# Patient Record
Sex: Female | Born: 1935
Health system: Southern US, Community
[De-identification: ages and names within clinical notes are randomized; demographics above are authoritative.]

## PROBLEM LIST (undated history)

## (undated) ENCOUNTER — Inpatient Hospital Stay: Payer: Medicare Other

## (undated) DIAGNOSIS — M1990 Unspecified osteoarthritis, unspecified site: Secondary | ICD-10-CM

## (undated) DIAGNOSIS — Z8619 Personal history of other infectious and parasitic diseases: Secondary | ICD-10-CM

## (undated) DIAGNOSIS — N390 Urinary tract infection, site not specified: Secondary | ICD-10-CM

## (undated) DIAGNOSIS — K219 Gastro-esophageal reflux disease without esophagitis: Secondary | ICD-10-CM

## (undated) DIAGNOSIS — J309 Allergic rhinitis, unspecified: Secondary | ICD-10-CM

## (undated) HISTORY — DX: Urinary tract infection, site not specified: N39.0

## (undated) HISTORY — DX: Personal history of other infectious and parasitic diseases: Z86.19

## (undated) HISTORY — DX: Unspecified osteoarthritis, unspecified site: M19.90

## (undated) HISTORY — PX: COLONOSCOPY: SHX174

## (undated) HISTORY — PX: TONSILLECTOMY: SUR1361

## (undated) HISTORY — DX: Allergic rhinitis, unspecified: J30.9

---

## 2014-04-20 DIAGNOSIS — B029 Zoster without complications: Secondary | ICD-10-CM | POA: Diagnosis not present

## 2014-04-25 DIAGNOSIS — J029 Acute pharyngitis, unspecified: Secondary | ICD-10-CM | POA: Diagnosis not present

## 2014-04-25 DIAGNOSIS — H6120 Impacted cerumen, unspecified ear: Secondary | ICD-10-CM | POA: Diagnosis not present

## 2014-04-28 DIAGNOSIS — J069 Acute upper respiratory infection, unspecified: Secondary | ICD-10-CM | POA: Diagnosis not present

## 2014-05-12 DIAGNOSIS — J00 Acute nasopharyngitis [common cold]: Secondary | ICD-10-CM | POA: Diagnosis not present

## 2014-05-12 DIAGNOSIS — B029 Zoster without complications: Secondary | ICD-10-CM | POA: Diagnosis not present

## 2014-05-12 DIAGNOSIS — J069 Acute upper respiratory infection, unspecified: Secondary | ICD-10-CM | POA: Diagnosis not present

## 2014-05-12 DIAGNOSIS — K219 Gastro-esophageal reflux disease without esophagitis: Secondary | ICD-10-CM | POA: Diagnosis not present

## 2014-07-27 DIAGNOSIS — Z0000 Encounter for general adult medical examination without abnormal findings: Secondary | ICD-10-CM | POA: Diagnosis not present

## 2014-07-27 DIAGNOSIS — M810 Age-related osteoporosis without current pathological fracture: Secondary | ICD-10-CM | POA: Diagnosis not present

## 2014-07-27 DIAGNOSIS — Z1211 Encounter for screening for malignant neoplasm of colon: Secondary | ICD-10-CM | POA: Diagnosis not present

## 2014-07-27 DIAGNOSIS — R4582 Worries: Secondary | ICD-10-CM | POA: Diagnosis not present

## 2014-07-27 DIAGNOSIS — Z1389 Encounter for screening for other disorder: Secondary | ICD-10-CM | POA: Diagnosis not present

## 2014-07-27 DIAGNOSIS — R634 Abnormal weight loss: Secondary | ICD-10-CM | POA: Diagnosis not present

## 2014-07-27 DIAGNOSIS — Z1231 Encounter for screening mammogram for malignant neoplasm of breast: Secondary | ICD-10-CM | POA: Diagnosis not present

## 2014-07-27 DIAGNOSIS — K5900 Constipation, unspecified: Secondary | ICD-10-CM | POA: Diagnosis not present

## 2014-08-05 DIAGNOSIS — R05 Cough: Secondary | ICD-10-CM | POA: Diagnosis not present

## 2014-08-05 DIAGNOSIS — K219 Gastro-esophageal reflux disease without esophagitis: Secondary | ICD-10-CM | POA: Diagnosis not present

## 2014-08-05 DIAGNOSIS — E780 Pure hypercholesterolemia: Secondary | ICD-10-CM | POA: Diagnosis not present

## 2014-08-05 DIAGNOSIS — J209 Acute bronchitis, unspecified: Secondary | ICD-10-CM | POA: Diagnosis not present

## 2014-08-14 DIAGNOSIS — B029 Zoster without complications: Secondary | ICD-10-CM | POA: Diagnosis not present

## 2014-08-19 DIAGNOSIS — R05 Cough: Secondary | ICD-10-CM | POA: Diagnosis not present

## 2014-08-25 DIAGNOSIS — K219 Gastro-esophageal reflux disease without esophagitis: Secondary | ICD-10-CM | POA: Diagnosis not present

## 2014-08-25 DIAGNOSIS — J209 Acute bronchitis, unspecified: Secondary | ICD-10-CM | POA: Diagnosis not present

## 2014-08-25 DIAGNOSIS — E780 Pure hypercholesterolemia: Secondary | ICD-10-CM | POA: Diagnosis not present

## 2014-08-25 DIAGNOSIS — R05 Cough: Secondary | ICD-10-CM | POA: Diagnosis not present

## 2014-09-03 DIAGNOSIS — K5900 Constipation, unspecified: Secondary | ICD-10-CM | POA: Diagnosis not present

## 2014-09-03 DIAGNOSIS — K30 Functional dyspepsia: Secondary | ICD-10-CM | POA: Diagnosis not present

## 2014-10-22 DIAGNOSIS — R300 Dysuria: Secondary | ICD-10-CM | POA: Diagnosis not present

## 2014-10-22 DIAGNOSIS — N760 Acute vaginitis: Secondary | ICD-10-CM | POA: Diagnosis not present

## 2014-11-09 DIAGNOSIS — R300 Dysuria: Secondary | ICD-10-CM | POA: Diagnosis not present

## 2014-11-09 DIAGNOSIS — N760 Acute vaginitis: Secondary | ICD-10-CM | POA: Diagnosis not present

## 2014-12-02 DIAGNOSIS — M5032 Other cervical disc degeneration, mid-cervical region: Secondary | ICD-10-CM | POA: Diagnosis not present

## 2014-12-03 DIAGNOSIS — H25013 Cortical age-related cataract, bilateral: Secondary | ICD-10-CM | POA: Diagnosis not present

## 2014-12-03 DIAGNOSIS — H2513 Age-related nuclear cataract, bilateral: Secondary | ICD-10-CM | POA: Diagnosis not present

## 2014-12-10 DIAGNOSIS — M542 Cervicalgia: Secondary | ICD-10-CM | POA: Diagnosis not present

## 2014-12-11 DIAGNOSIS — M542 Cervicalgia: Secondary | ICD-10-CM | POA: Diagnosis not present

## 2014-12-15 DIAGNOSIS — M542 Cervicalgia: Secondary | ICD-10-CM | POA: Diagnosis not present

## 2014-12-17 DIAGNOSIS — M542 Cervicalgia: Secondary | ICD-10-CM | POA: Diagnosis not present

## 2014-12-21 DIAGNOSIS — M542 Cervicalgia: Secondary | ICD-10-CM | POA: Diagnosis not present

## 2014-12-24 DIAGNOSIS — M542 Cervicalgia: Secondary | ICD-10-CM | POA: Diagnosis not present

## 2014-12-29 DIAGNOSIS — M542 Cervicalgia: Secondary | ICD-10-CM | POA: Diagnosis not present

## 2014-12-30 DIAGNOSIS — I10 Essential (primary) hypertension: Secondary | ICD-10-CM | POA: Diagnosis not present

## 2014-12-30 DIAGNOSIS — H6120 Impacted cerumen, unspecified ear: Secondary | ICD-10-CM | POA: Diagnosis not present

## 2015-01-21 DIAGNOSIS — Z23 Encounter for immunization: Secondary | ICD-10-CM | POA: Diagnosis not present

## 2015-03-24 DIAGNOSIS — I10 Essential (primary) hypertension: Secondary | ICD-10-CM | POA: Diagnosis not present

## 2015-03-24 DIAGNOSIS — R05 Cough: Secondary | ICD-10-CM | POA: Diagnosis not present

## 2015-03-24 DIAGNOSIS — K219 Gastro-esophageal reflux disease without esophagitis: Secondary | ICD-10-CM | POA: Diagnosis not present

## 2015-03-24 DIAGNOSIS — E7800 Pure hypercholesterolemia, unspecified: Secondary | ICD-10-CM | POA: Diagnosis not present

## 2015-03-24 DIAGNOSIS — E530 Riboflavin deficiency: Secondary | ICD-10-CM | POA: Diagnosis not present

## 2015-03-24 DIAGNOSIS — K5904 Chronic idiopathic constipation: Secondary | ICD-10-CM | POA: Diagnosis not present

## 2015-03-31 DIAGNOSIS — Z1211 Encounter for screening for malignant neoplasm of colon: Secondary | ICD-10-CM | POA: Diagnosis not present

## 2015-03-31 DIAGNOSIS — Z124 Encounter for screening for malignant neoplasm of cervix: Secondary | ICD-10-CM | POA: Diagnosis not present

## 2015-04-01 DIAGNOSIS — K5900 Constipation, unspecified: Secondary | ICD-10-CM | POA: Diagnosis not present

## 2015-04-01 DIAGNOSIS — K30 Functional dyspepsia: Secondary | ICD-10-CM | POA: Diagnosis not present

## 2015-06-10 DIAGNOSIS — H04123 Dry eye syndrome of bilateral lacrimal glands: Secondary | ICD-10-CM | POA: Diagnosis not present

## 2015-06-10 DIAGNOSIS — H25013 Cortical age-related cataract, bilateral: Secondary | ICD-10-CM | POA: Diagnosis not present

## 2015-06-10 DIAGNOSIS — H2513 Age-related nuclear cataract, bilateral: Secondary | ICD-10-CM | POA: Diagnosis not present

## 2015-07-02 DIAGNOSIS — J392 Other diseases of pharynx: Secondary | ICD-10-CM | POA: Diagnosis not present

## 2015-07-07 DIAGNOSIS — J301 Allergic rhinitis due to pollen: Secondary | ICD-10-CM | POA: Diagnosis not present

## 2015-08-04 DIAGNOSIS — D2271 Melanocytic nevi of right lower limb, including hip: Secondary | ICD-10-CM | POA: Diagnosis not present

## 2015-08-04 DIAGNOSIS — D224 Melanocytic nevi of scalp and neck: Secondary | ICD-10-CM | POA: Diagnosis not present

## 2015-08-04 DIAGNOSIS — Z85828 Personal history of other malignant neoplasm of skin: Secondary | ICD-10-CM | POA: Diagnosis not present

## 2015-08-04 DIAGNOSIS — D2239 Melanocytic nevi of other parts of face: Secondary | ICD-10-CM | POA: Diagnosis not present

## 2015-08-04 DIAGNOSIS — D485 Neoplasm of uncertain behavior of skin: Secondary | ICD-10-CM | POA: Diagnosis not present

## 2015-08-04 DIAGNOSIS — D225 Melanocytic nevi of trunk: Secondary | ICD-10-CM | POA: Diagnosis not present

## 2015-08-04 DIAGNOSIS — C44301 Unspecified malignant neoplasm of skin of nose: Secondary | ICD-10-CM | POA: Diagnosis not present

## 2015-08-04 DIAGNOSIS — D2272 Melanocytic nevi of left lower limb, including hip: Secondary | ICD-10-CM | POA: Diagnosis not present

## 2015-08-12 DIAGNOSIS — R300 Dysuria: Secondary | ICD-10-CM | POA: Diagnosis not present

## 2015-08-12 DIAGNOSIS — N952 Postmenopausal atrophic vaginitis: Secondary | ICD-10-CM | POA: Diagnosis not present

## 2015-08-22 DIAGNOSIS — B029 Zoster without complications: Secondary | ICD-10-CM | POA: Diagnosis not present

## 2015-10-04 DIAGNOSIS — Z85828 Personal history of other malignant neoplasm of skin: Secondary | ICD-10-CM | POA: Diagnosis not present

## 2015-10-04 DIAGNOSIS — C44311 Basal cell carcinoma of skin of nose: Secondary | ICD-10-CM | POA: Diagnosis not present

## 2015-10-06 ENCOUNTER — Ambulatory Visit: Payer: PRIVATE HEALTH INSURANCE | Admitting: Family Medicine

## 2015-10-22 ENCOUNTER — Telehealth: Payer: Self-pay | Admitting: Family Medicine

## 2015-10-22 ENCOUNTER — Encounter: Payer: Self-pay | Admitting: Family Medicine

## 2015-10-22 ENCOUNTER — Ambulatory Visit (INDEPENDENT_AMBULATORY_CARE_PROVIDER_SITE_OTHER): Payer: Medicare Other | Admitting: Family Medicine

## 2015-10-22 VITALS — BP 100/58 | HR 93 | Temp 98.4°F | Ht 62.0 in | Wt 117.5 lb

## 2015-10-22 DIAGNOSIS — R634 Abnormal weight loss: Secondary | ICD-10-CM

## 2015-10-22 DIAGNOSIS — C4491 Basal cell carcinoma of skin, unspecified: Secondary | ICD-10-CM

## 2015-10-22 DIAGNOSIS — K5900 Constipation, unspecified: Secondary | ICD-10-CM | POA: Diagnosis not present

## 2015-10-22 LAB — CBC WITH DIFFERENTIAL/PLATELET
BASOS ABS: 0 {cells}/uL (ref 0–200)
Basophils Relative: 0 %
EOS ABS: 84 {cells}/uL (ref 15–500)
EOS PCT: 1 %
HCT: 43.1 % (ref 35.0–45.0)
HEMOGLOBIN: 14.4 g/dL (ref 11.7–15.5)
Lymphocytes Relative: 26 %
Lymphs Abs: 2184 cells/uL (ref 850–3900)
MCH: 29.8 pg (ref 27.0–33.0)
MCHC: 33.4 g/dL (ref 32.0–36.0)
MCV: 89 fL (ref 80.0–100.0)
MONOS PCT: 9 %
MPV: 10.5 fL (ref 7.5–12.5)
Monocytes Absolute: 756 cells/uL (ref 200–950)
NEUTROS ABS: 5376 {cells}/uL (ref 1500–7800)
NEUTROS PCT: 64 %
PLATELETS: 236 10*3/uL (ref 140–400)
RBC: 4.84 MIL/uL (ref 3.80–5.10)
RDW: 13.5 % (ref 11.0–15.0)
WBC: 8.4 10*3/uL (ref 3.8–10.8)

## 2015-10-22 LAB — SEDIMENTATION RATE: Sed Rate: 4 mm/hr (ref 0–30)

## 2015-10-22 LAB — TSH: TSH: 1.48 mIU/L

## 2015-10-22 NOTE — Patient Instructions (Signed)
Nice to see you. We are going to obtain some lab work to evaluate her weight loss. We will request her records from her prior physicians in your dermatologist in [place]. Please continue your stool softener MiraLAX for your constipation. If you develop abdominal pain, fevers, blood in your stool, or any new or changing symptoms please seek medical attention.

## 2015-10-22 NOTE — Progress Notes (Signed)
Patient ID: Carol Stark, female   DOB: 22-Oct-1931, 82 y.o.   MRN: [id number]  Tommi Rumps, MD Phone: [phone]  Carol Stark is a 82 y.o. female who presents today for new patient visit.  Unintentional weight loss: Patient notes over the last several months she has lost about 20 pounds. Has been gradual. Was around 140. Has had a lot of stress related to moving and a lot of hassle related to the person who was managing her money previously. She notes a: Guarded about a year ago that was negative. Mammogram was about a year ago that was negative. No recent Pap smears. No significant night sweats. No fevers. No cough. She has been exposed to TB in the past and never diagnosed with TB. She underwent treatment for 6 months for this exposure. She does have a history of skin cancer and had a recent Mohs surgery on her nose. She is unsure what kind of cancer was.  She also notes some constipation. She is currently on a stool softener and MiraLAX. This is helped significant. Bowel movements daily. No abdominal pain. No blood in her stool. Notes she's not quite back to normal.   Active Ambulatory Problems    Diagnosis Date Noted  . Unintentional weight loss 10/22/2015  . Constipation 10/22/2015   Resolved Ambulatory Problems    Diagnosis Date Noted  . No Resolved Ambulatory Problems   Past Medical History  Diagnosis Date  . Arthritis   . History of chicken pox   . Allergic rhinitis   . UTI (lower urinary tract infection)     Family History  Problem Relation Age of Onset  . Arthritis Mother   . Heart disease Father     Social History   Social History  . Marital Status: Single    Spouse Name: N/A  . Number of Children: N/A  . Years of Education: N/A   Occupational History  . Not on file.   Social History Main Topics  . Smoking status: Never Smoker   . Smokeless tobacco: Not on file  . Alcohol Use: 0.0 oz/week    0 Standard drinks or equivalent per week  .  Drug Use: No  . Sexual Activity: Not on file   Other Topics Concern  . Not on file   Social History Narrative  . No narrative on file    ROS  General: Positive or unexplained weight loss,negative for  fever Skin: Negative for new or changing mole, sore that won't heal HEENT: Negative for trouble hearing, trouble seeing, ringing in ears, mouth sores, hoarseness, change in voice, dysphagia. CV:  Negative for chest pain, dyspnea, edema, palpitations Resp: Negative for cough, dyspnea, hemoptysis GI:  positive for constipation, Negative for nausea, vomiting, diarrhea, abdominal pain, melena, hematochezia. GU: Negative for dysuria, incontinence, urinary hesitance, hematuria, vaginal or penile discharge, polyuria, sexual difficulty, lumps in testicle or breasts MSK: Negative for muscle cramps or aches,  positive joint pain or swelling Neuro: Negative for headaches, weakness, numbness, dizziness, passing out/fainting Psych:  positive for stress, Negative for depression, anxiety, memory problems  Objective  Physical Exam Filed Vitals:   10/22/15 1454  BP: 100/58  Pulse: 93  Temp: 98.4 F (36.9 C)    BP Readings from Last 3 Encounters:  10/22/15 100/58   Wt Readings from Last 3 Encounters:  10/22/15 117 lb 8 oz (53.298 kg)    Physical Exam  Constitutional: She is well-developed, well-nourished, and in no distress.  HENT:  Head: Normocephalic and atraumatic.  Right Ear: External ear normal.  Left Ear: External ear normal.  Mouth/Throat: Oropharynx is clear and moist. No oropharyngeal exudate.  Right upper outer area of the iris with brown spot noted that the patient states has been there her whole life  Eyes: Conjunctivae are normal. Pupils are equal, round, and reactive to light.  Neck: Neck supple.  No supraclavicular lymphadenopathy  Cardiovascular: Normal rate, regular rhythm and normal heart sounds.   Pulmonary/Chest: Effort normal and breath sounds normal.  Abdominal:  Soft. Bowel sounds are normal. She exhibits no distension. There is no tenderness. There is no rebound and no guarding.  Musculoskeletal: She exhibits no edema.  Lymphadenopathy:    She has no cervical adenopathy.  Neurological: She is alert. Gait normal.  Skin: Skin is warm and dry.  Psychiatric: Mood and affect normal.     Assessment/Plan:   Unintentional weight loss Patient reports about a 20 pound weight loss over the last several months. This is unintentional. Notes a significant amount of stress recently and she attributes it to this. Of note she has recently had a skin cancer removed from her nose has also been exposed to TB in the past though notes no symptoms related to the TV. Reports she had a negative chest x-ray. Overall benign exam. We will check some basic lab work as outlined below. We'll request records for cancer screening in the past. We will request her dermatology records as well. Depending on lab work and results from records will determine further workup at that time. She will continue to monitor her weight.  Constipation Improving with MiraLAX and stool softener. Benign exam. She'll continue this. Continue monitor.    Orders Placed This Encounter  Procedures  . C-reactive protein  . Sed Rate (ESR)  . Comp Met (CMET)  . CBC w/Diff  . TSH    Tommi Rumps, MD [place]

## 2015-10-22 NOTE — Progress Notes (Signed)
Pre visit review using our clinic review tool, if applicable. No additional management support is needed unless otherwise documented below in the visit note. 

## 2015-10-22 NOTE — Assessment & Plan Note (Addendum)
Improving with MiraLAX and stool softener. Benign exam. She'll continue this. Continue monitor.

## 2015-10-22 NOTE — Telephone Encounter (Signed)
The patient called to inform the physician that a basil cell mole was removed by [place] phone # [phone].

## 2015-10-22 NOTE — Assessment & Plan Note (Addendum)
Patient reports about a 20 pound weight loss over the last several months. This is unintentional. Notes a significant amount of stress recently and she attributes it to this. Of note she has recently had a skin cancer removed from her nose has also been exposed to TB in the past though notes no symptoms related to the TV. Reports she had a negative chest x-ray. Overall benign exam. We will check some basic lab work as outlined below. We'll request records for cancer screening in the past. We will request her dermatology records as well. Depending on lab work and results from records will determine further workup at that time. She will continue to monitor her weight.

## 2015-10-23 LAB — COMPREHENSIVE METABOLIC PANEL
ALBUMIN: 4.2 g/dL (ref 3.6–5.1)
ALK PHOS: 61 U/L (ref 33–130)
ALT: 12 U/L (ref 6–29)
AST: 15 U/L (ref 10–35)
BILIRUBIN TOTAL: 0.3 mg/dL (ref 0.2–1.2)
BUN: 17 mg/dL (ref 7–25)
CO2: 24 mmol/L (ref 20–31)
Calcium: 9.2 mg/dL (ref 8.6–10.4)
Chloride: 102 mmol/L (ref 98–110)
Creat: 0.77 mg/dL (ref 0.60–0.88)
GLUCOSE: 91 mg/dL (ref 65–99)
POTASSIUM: 4.1 mmol/L (ref 3.5–5.3)
Sodium: 140 mmol/L (ref 135–146)
TOTAL PROTEIN: 7 g/dL (ref 6.1–8.1)

## 2015-10-23 LAB — C-REACTIVE PROTEIN: CRP: <0.5 mg/dL (ref <0.60)

## 2015-10-25 DIAGNOSIS — C4491 Basal cell carcinoma of skin, unspecified: Secondary | ICD-10-CM | POA: Insufficient documentation

## 2015-10-25 NOTE — Telephone Encounter (Signed)
FYI

## 2015-10-25 NOTE — Telephone Encounter (Signed)
Noted. Please request records from Dr. Sarajane Jews.

## 2015-11-08 ENCOUNTER — Ambulatory Visit (INDEPENDENT_AMBULATORY_CARE_PROVIDER_SITE_OTHER): Payer: Medicare Other | Admitting: Family Medicine

## 2015-11-08 ENCOUNTER — Encounter (INDEPENDENT_AMBULATORY_CARE_PROVIDER_SITE_OTHER): Payer: Self-pay

## 2015-11-08 DIAGNOSIS — R634 Abnormal weight loss: Secondary | ICD-10-CM

## 2015-11-08 DIAGNOSIS — K219 Gastro-esophageal reflux disease without esophagitis: Secondary | ICD-10-CM | POA: Diagnosis not present

## 2015-11-08 NOTE — Progress Notes (Signed)
Pre visit review using our clinic review tool, if applicable. No additional management support is needed unless otherwise documented below in the visit note. 

## 2015-11-08 NOTE — Assessment & Plan Note (Signed)
Well-controlled with PPI. Continue to monitor.

## 2015-11-08 NOTE — Assessment & Plan Note (Signed)
Patient doing well overall. Prior workup unremarkable. Weight is actually up now. I believe this is likely related to stress. We will have her continue to monitor. We will have her return in 4-6 weeks for a weight check. If loses weight would consider further workup.

## 2015-11-08 NOTE — Progress Notes (Signed)
  Tommi Rumps, MD Phone: [phone]  Carol Stark is a 82 y.o. female who presents today for follow-up.  Patient presents for a weight check today. Had lost about 20 pounds in several months while in the process of moving. Notes she had been significantly stressed with this. She is much less stressed now and is eating more. Has gained several pounds. No depression or anxiety. No night sweats. Prior lab work was unremarkable. Skin cancer removed from her nose was a basal cell carcinoma. Does have rare reflux and rare nausea. Is taking esomeprazole and raising the head of the bed for her reflux. No blood in her stool. No abdominal pain.   ROS see history of present illness  Objective  Physical Exam Vitals:   11/08/15 1550  BP: 106/70  Pulse: 96  Temp: 98.2 F (36.8 C)    BP Readings from Last 3 Encounters:  11/08/15 106/70  10/22/15 (!) 100/58   Wt Readings from Last 3 Encounters:  11/08/15 119 lb 9.6 oz (54.3 kg)  10/22/15 117 lb 8 oz (53.3 kg)    Physical Exam  Constitutional: No distress.  HENT:  Head: Normocephalic and atraumatic.  Cardiovascular: Normal rate, regular rhythm and normal heart sounds.   Pulmonary/Chest: Effort normal and breath sounds normal.  Abdominal: Soft. Bowel sounds are normal. She exhibits no distension. There is no tenderness. There is no rebound and no guarding.  Neurological: She is alert. Gait normal.  Skin: Skin is warm and dry. She is not diaphoretic.  Psychiatric: Mood and affect normal.     Assessment/Plan: Please see individual problem list.  Unintentional weight loss Patient doing well overall. Prior workup unremarkable. Weight is actually up now. I believe this is likely related to stress. We will have her continue to monitor. We will have her return in 4-6 weeks for a weight check. If loses weight would consider further workup.  GERD (gastroesophageal reflux disease) Well-controlled with PPI. Continue to  monitor.   Tommi Rumps, MD [place]

## 2015-11-08 NOTE — Telephone Encounter (Signed)
Do not have signed medical release form

## 2015-11-08 NOTE — Patient Instructions (Signed)
Nice to see you. Your weight has moved in the right direction. Your weight loss is likely related to stress. Please continue to work on diet and managing her stress. Please continue your reflux medication. I would like for you to come back in 4-6 weeks for a nurse visit for a weight check and then 3-4 months with me.

## 2015-11-15 DIAGNOSIS — H6123 Impacted cerumen, bilateral: Secondary | ICD-10-CM | POA: Diagnosis not present

## 2015-12-15 ENCOUNTER — Ambulatory Visit (INDEPENDENT_AMBULATORY_CARE_PROVIDER_SITE_OTHER): Payer: Medicare Other | Admitting: Family Medicine

## 2015-12-15 ENCOUNTER — Encounter (INDEPENDENT_AMBULATORY_CARE_PROVIDER_SITE_OTHER): Payer: Self-pay

## 2015-12-15 ENCOUNTER — Encounter: Payer: Self-pay | Admitting: Family Medicine

## 2015-12-15 DIAGNOSIS — K5900 Constipation, unspecified: Secondary | ICD-10-CM | POA: Diagnosis not present

## 2015-12-15 DIAGNOSIS — K219 Gastro-esophageal reflux disease without esophagitis: Secondary | ICD-10-CM

## 2015-12-15 MED ORDER — RANITIDINE HCL 150 MG PO TABS: 150.0000 mg | ORAL_TABLET | Freq: Two times a day (BID) | ORAL | 1 refills | Status: DC

## 2015-12-15 MED ORDER — POLYETHYLENE GLYCOL 3350 17 GM/SCOOP PO POWD: 17.0000 g | Freq: Two times a day (BID) | ORAL | 1 refills | Status: DC

## 2015-12-15 MED ORDER — ESOMEPRAZOLE MAGNESIUM 40 MG PO CPDR: 40.0000 mg | DELAYED_RELEASE_CAPSULE | Freq: Every day | ORAL | 3 refills | Status: DC

## 2015-12-15 NOTE — Progress Notes (Signed)
Pre visit review using our clinic review tool, if applicable. No additional management support is needed unless otherwise documented below in the visit note. 

## 2015-12-15 NOTE — Assessment & Plan Note (Signed)
Some sour taste today. No other reflux symptoms. We'll have her continue her Nexium. If the Nexium is not beneficial she will start on Zantac in addition to her Nexium to see if this helps with the reflux. If this is not beneficial she will let us know.

## 2015-12-15 NOTE — Patient Instructions (Signed)
Nice to see you. We're going to have you increase your dose of MiraLAX to twice daily. We will refill your Nexium for your reflux. You should add Zantac if the Nexium is not beneficial. If you develop worsening stomach pain, blood in your stool, inability to pass gas, vomiting, or any new or changing symptoms please seek medical attention.

## 2015-12-15 NOTE — Progress Notes (Signed)
  Tommi Rumps, MD Phone: [phone]  Carol Stark is a 82 y.o. female who presents today for same day visit.  Patient reports constipation over the last week. Initially started MiraLAX and Dulcolax. Had extreme cramps with the Dulcolax. Then was given an enema 2 by the nurse at her facility. Notes bowel movements have been small balls of stool though had improved bowel movements after the enemas. Continuing to have small balls of stool and having bowel movements. Last bowel movement was yesterday. Taking in some food. Drinking prune juice. She is passing gas. No vomiting. No abdominal pain. Does note a bad taste in her mouth and describes it as sour just this morning. No reflux burning sensation. Takes Nexium at bedtime. No blood in her stool.  PMH: Reports a history of constipation in the past that was previously responsive to fiber supplementation   ROS see history of present illness  Objective  Physical Exam Vitals:   12/15/15 1302  BP: 136/68  Pulse: 92  Temp: 98.2 F (36.8 C)    BP Readings from Last 3 Encounters:  12/15/15 136/68  11/08/15 106/70  10/22/15 (!) 100/58   Wt Readings from Last 3 Encounters:  12/15/15 118 lb 9.6 oz (53.8 kg)  11/08/15 119 lb 9.6 oz (54.3 kg)  10/22/15 117 lb 8 oz (53.3 kg)    Physical Exam  Constitutional: No distress.  Cardiovascular: Normal rate, regular rhythm and normal heart sounds.   Pulmonary/Chest: Effort normal and breath sounds normal.  Abdominal: Soft. Bowel sounds are normal. She exhibits no distension. There is tenderness (minimal soreness throughout her mid abdomen). There is no rebound and no guarding.  Neurological: She is alert. Gait normal.  Skin: Skin is warm and dry. She is not diaphoretic.     Assessment/Plan: Please see individual problem list.  Constipation This issue has worsened recently. Mild soreness on abdominal exam. Discussed obtaining lab work to rule out other causes though she declined.  She could potentially still be constipated versus having cleaned herself completely out with the 2 enemas. We will have her increase her MiraLAX to twice daily. If not improving with MiraLAX would have her add Colace. If this is not beneficial could consider lactulose. She's given return precautions.  GERD (gastroesophageal reflux disease) Some sour taste today. No other reflux symptoms. We'll have her continue her Nexium. If the Nexium is not beneficial she will start on Zantac in addition to her Nexium to see if this helps with the reflux. If this is not beneficial she will let us know.   No orders of the defined types were placed in this encounter.   Meds ordered this encounter  Medications  . esomeprazole (NEXIUM) 40 MG capsule    Sig: Take 1 capsule (40 mg total) by mouth daily.    Dispense:  90 capsule    Refill:  3  . polyethylene glycol powder (GLYCOLAX/MIRALAX) powder    Sig: Take 17 g by mouth 2 (two) times daily.    Dispense:  3350 g    Refill:  1  . ranitidine (ZANTAC) 150 MG tablet    Sig: Take 1 tablet (150 mg total) by mouth 2 (two) times daily.    Dispense:  60 tablet    Refill:  [place], MD [place]

## 2015-12-15 NOTE — Assessment & Plan Note (Signed)
This issue has worsened recently. Mild soreness on abdominal exam. Discussed obtaining lab work to rule out other causes though she declined. She could potentially still be constipated versus having cleaned herself completely out with the 2 enemas. We will have her increase her MiraLAX to twice daily. If not improving with MiraLAX would have her add Colace. If this is not beneficial could consider lactulose. She's given return precautions.

## 2015-12-16 ENCOUNTER — Telehealth: Payer: Self-pay | Admitting: Family Medicine

## 2015-12-16 NOTE — Telephone Encounter (Signed)
Patient is still having the same [place] as yesterday during visit, no changes. please advise.

## 2015-12-16 NOTE — Telephone Encounter (Signed)
Patient can give the MiraLAX twice daily several days to work or I can send in lactulose for her to take. See what she would like to do. Thanks.

## 2015-12-16 NOTE — Telephone Encounter (Signed)
Patient was informed and she stated she will stay on miralax till next week and if not improvement she will call for something stronger.

## 2015-12-16 NOTE — Telephone Encounter (Signed)
Pt is not feeling any better. She was seen yesterday by Dr. Caryl Bis. Please advise pt. [phone].

## 2015-12-17 NOTE — Telephone Encounter (Signed)
Noted  

## 2015-12-21 DIAGNOSIS — R110 Nausea: Secondary | ICD-10-CM | POA: Diagnosis not present

## 2015-12-24 ENCOUNTER — Telehealth: Payer: Self-pay | Admitting: Family Medicine

## 2015-12-24 DIAGNOSIS — R110 Nausea: Secondary | ICD-10-CM | POA: Diagnosis not present

## 2015-12-24 DIAGNOSIS — K219 Gastro-esophageal reflux disease without esophagitis: Secondary | ICD-10-CM

## 2015-12-24 NOTE — Telephone Encounter (Signed)
Pt states that her stomach is really upset. States that she is trying Kaopectate now and is requesting a referral to [facility]. I gave her number to [facility] gastro to see if they can go ahead and schedule her. Can you please enter referral in case they require a one?

## 2015-12-24 NOTE — Telephone Encounter (Signed)
Referral has been placed. If her symptoms do not improve I would suggest reevaluation.

## 2015-12-27 ENCOUNTER — Ambulatory Visit (INDEPENDENT_AMBULATORY_CARE_PROVIDER_SITE_OTHER): Payer: Medicare Other | Admitting: Family Medicine

## 2015-12-27 ENCOUNTER — Encounter: Payer: Self-pay | Admitting: Family Medicine

## 2015-12-27 VITALS — BP 120/70 | HR 91 | Temp 98.6°F | Wt 114.0 lb

## 2015-12-27 DIAGNOSIS — R197 Diarrhea, unspecified: Secondary | ICD-10-CM | POA: Diagnosis not present

## 2015-12-27 DIAGNOSIS — R195 Other fecal abnormalities: Secondary | ICD-10-CM | POA: Insufficient documentation

## 2015-12-27 MED ORDER — ONDANSETRON HCL 4 MG PO TABS: 4.0000 mg | ORAL_TABLET | Freq: Three times a day (TID) | ORAL | 0 refills | Status: DC | PRN

## 2015-12-27 NOTE — Progress Notes (Signed)
  Tommi Rumps, MD Phone: [phone]  Carol Stark is a 82 y.o. female who presents today for follow-up.  Patient was seen several weeks ago for constipation. Notes more recently she has had looser stools than usual. More frequent stools as well. They're not liquidy. She's also had some nausea. Decreased appetite. No vomiting or blood in her stools. No fevers. Feels bloated and gassy. She is passing gas and having bowel movements daily. Has been taking Pepto-Bismol and Imodium for this. Drink plenty of fluids to not eating very much. No abdominal pain with this.  ROS see history of present illness  Objective  Physical Exam Vitals:   12/27/15 1551  BP: 120/70  Pulse: 91  Temp: 98.6 F (37 C)    BP Readings from Last 3 Encounters:  12/27/15 120/70  12/15/15 136/68  11/08/15 106/70   Wt Readings from Last 3 Encounters:  12/27/15 114 lb (51.7 kg)  12/15/15 118 lb 9.6 oz (53.8 kg)  11/08/15 119 lb 9.6 oz (54.3 kg)    Physical Exam  Constitutional: No distress.  Cardiovascular: Normal rate, regular rhythm and normal heart sounds.   Pulmonary/Chest: Effort normal and breath sounds normal.  Abdominal: Soft. Bowel sounds are normal. She exhibits no distension. There is no tenderness. There is no rebound and no guarding.  Neurological: She is alert. Gait normal.  Skin: Skin is warm and dry. She is not diaphoretic.     Assessment/Plan: Please see individual problem list.  Loose stools Patient recently treated for constipation though more recently having loose stools. Notes some nausea and bloating with this as well. Benign abdominal exam. Concerning thing is that she's not eating very much with this and has had 4 pounds of weight loss since I last saw her. She has already been referred to GI for further evaluation of this issue. We will check a CMP and CBC today. She will stop the Imodium. Zofran for nausea. I encouraged her to keep up her liquid intake. Return  precautions in AVS.   Orders Placed This Encounter  Procedures  . Comp Met (CMET)  . CBC    Meds ordered this encounter  Medications  . ondansetron (ZOFRAN) 4 MG tablet    Sig: Take 1 tablet (4 mg total) by mouth every 8 (eight) hours as needed for nausea or vomiting.    Dispense:  20 tablet    Refill:  0    Tommi Rumps, MD [place]

## 2015-12-27 NOTE — Patient Instructions (Signed)
Nice to see you. We are going to get some lab work to evaluate for further cause. You can take the Zofran for nausea. If you develop abdominal pain, blood in your stool, fevers, or any new or changing symptoms please seek medical attention.

## 2015-12-27 NOTE — Assessment & Plan Note (Signed)
Patient recently treated for constipation though more recently having loose stools. Notes some nausea and bloating with this as well. Benign abdominal exam. Concerning thing is that she's not eating very much with this and has had 4 pounds of weight loss since I last saw her. She has already been referred to GI for further evaluation of this issue. We will check a CMP and CBC today. She will stop the Imodium. Zofran for nausea. I encouraged her to keep up her liquid intake. Return precautions in AVS.

## 2015-12-28 LAB — COMPREHENSIVE METABOLIC PANEL
ALT: 10 U/L (ref 0–35)
AST: 13 U/L (ref 0–37)
Albumin: 4.2 g/dL (ref 3.5–5.2)
Alkaline Phosphatase: 65 U/L (ref 39–117)
BUN: 17 mg/dL (ref 6–23)
CALCIUM: 9.5 mg/dL (ref 8.4–10.5)
CHLORIDE: 104 meq/L (ref 96–112)
CO2: 30 meq/L (ref 19–32)
Creatinine, Ser: 0.73 mg/dL (ref 0.40–1.20)
GFR: 81.16 mL/min (ref >60.00)
Glucose, Bld: 97 mg/dL (ref 70–99)
POTASSIUM: 4 meq/L (ref 3.5–5.1)
Sodium: 139 mEq/L (ref 135–145)
Total Bilirubin: 0.4 mg/dL (ref 0.2–1.2)
Total Protein: 7.3 g/dL (ref 6.0–8.3)

## 2015-12-28 LAB — CBC
HEMATOCRIT: 42.5 % (ref 36.0–46.0)
Hemoglobin: 14.4 g/dL (ref 12.0–15.0)
MCHC: 33.8 g/dL (ref 30.0–36.0)
MCV: 88.9 fl (ref 78.0–100.0)
PLATELETS: 263 10*3/uL (ref 150.0–400.0)
RBC: 4.78 Mil/uL (ref 3.87–5.11)
RDW: 13 % (ref 11.5–15.5)
WBC: 9.1 10*3/uL (ref 4.0–10.5)

## 2015-12-29 ENCOUNTER — Telehealth: Payer: Self-pay | Admitting: *Deleted

## 2015-12-29 NOTE — Telephone Encounter (Signed)
Informed patient that results have not been interpreted by Dr. Caryl Bis yet. Patient stated you can leave meassage at eityher [phone] and [phone]

## 2015-12-29 NOTE — Telephone Encounter (Signed)
Patient requested lab results  Pt contact [phone]

## 2015-12-31 ENCOUNTER — Ambulatory Visit
Admission: RE | Admit: 2015-12-31 | Discharge: 2015-12-31 | Disposition: A | Discharge status: Home / Self Care | Payer: Medicare Other | Source: Ambulatory Visit | Attending: Family Medicine | Admitting: Family Medicine

## 2015-12-31 ENCOUNTER — Ambulatory Visit (INDEPENDENT_AMBULATORY_CARE_PROVIDER_SITE_OTHER): Payer: Medicare Other | Admitting: Family Medicine

## 2015-12-31 VITALS — BP 120/74 | HR 94 | Temp 98.3°F | Wt 116.0 lb

## 2015-12-31 DIAGNOSIS — K5900 Constipation, unspecified: Secondary | ICD-10-CM | POA: Insufficient documentation

## 2015-12-31 NOTE — Progress Notes (Signed)
Pre visit review using our clinic review tool, if applicable. No additional management support is needed unless otherwise documented below in the visit note. 

## 2015-12-31 NOTE — Assessment & Plan Note (Signed)
Patient with intermittent constipation and loose stools. Having more normal bowel movements currently. Complained of darker stools and FOBT was negative. Given persistent issues with this we will obtain a KUB. She will take Zofran for nausea. She'll keep her appointment with GI.

## 2015-12-31 NOTE — Progress Notes (Signed)
  Tommi Rumps, MD Phone: [phone]  Carol Stark is a 82 y.o. female who presents today for follow-up.  Patient notes she continues to not feel well. Just has no appetite. Feels very gassy. No abdominal pain. No blood in her stool. Does note darker and possibly black stools today. Has not taken any medicines over the last several days. Did take Pepto-Bismol last weekend. No diarrhea. No fevers. Has had 2 bowel movements in the last day. Overall just doesn't feel well. No fevers. Has an appointment with GI in October.  ROS see history of present illness  Objective  Physical Exam Vitals:   12/31/15 1052  BP: 120/74  Pulse: 94  Temp: 98.3 F (36.8 C)    BP Readings from Last 3 Encounters:  12/31/15 120/74  12/27/15 120/70  12/15/15 136/68   Wt Readings from Last 3 Encounters:  12/31/15 116 lb (52.6 kg)  12/27/15 114 lb (51.7 kg)  12/15/15 118 lb 9.6 oz (53.8 kg)    Physical Exam  Constitutional: No distress.  Cardiovascular: Normal rate, regular rhythm and normal heart sounds.   Pulmonary/Chest: Effort normal and breath sounds normal.  Abdominal: Soft. Bowel sounds are normal. She exhibits no distension. There is no tenderness. There is no rebound and no guarding.  Genitourinary: Rectal exam shows guaiac negative stool.  Genitourinary Comments: No impacted stool in the rectal vault, soft stool noted  Neurological: She is alert. Gait normal.  Skin: Skin is warm and dry. She is not diaphoretic.     Assessment/Plan: Please see individual problem list.  Constipation Patient with intermittent constipation and loose stools. Having more normal bowel movements currently. Complained of darker stools and FOBT was negative. Given persistent issues with this we will obtain a KUB. She will take Zofran for nausea. She'll keep her appointment with GI.   Orders Placed This Encounter  Procedures  . DG Abd 2 Views    Standing Status:   Future    Standing Expiration Date:    03/01/2017    Order Specific Question:   Reason for Exam (SYMPTOM  OR DIAGNOSIS REQUIRED)    Answer:   nausea, constipation, abdominal soreness for several weeks    Order Specific Question:   Preferred imaging location?    Answer:   [facility]    Tommi Rumps, MD [place]

## 2015-12-31 NOTE — Patient Instructions (Signed)
Nice to see you. Please keep your appointment with GI. Please try to eat small bland meals to see if that'll be beneficial. If you develop fevers, blood in her stool, abdominal pain, or any new or changing symptoms please seek medical attention.

## 2016-01-05 DIAGNOSIS — R110 Nausea: Secondary | ICD-10-CM | POA: Diagnosis not present

## 2016-01-10 ENCOUNTER — Telehealth: Payer: Self-pay | Admitting: Family Medicine

## 2016-01-10 NOTE — Telephone Encounter (Signed)
Pt called wanting her lab work to be sent to the doctor in [place] Dr Landry Mellow. Fax to [phone]. Thank you!  Please call pt if it does not go through. Thank you!

## 2016-01-10 NOTE — Telephone Encounter (Signed)
Faxed labs to patients old physician.

## 2016-01-13 ENCOUNTER — Other Ambulatory Visit: Payer: Self-pay | Admitting: Gastroenterology

## 2016-01-13 DIAGNOSIS — R1310 Dysphagia, unspecified: Secondary | ICD-10-CM

## 2016-01-13 DIAGNOSIS — R198 Other specified symptoms and signs involving the digestive system and abdomen: Secondary | ICD-10-CM | POA: Diagnosis not present

## 2016-01-13 DIAGNOSIS — K2100 Gastro-esophageal reflux disease with esophagitis, without bleeding: Secondary | ICD-10-CM

## 2016-01-13 DIAGNOSIS — K219 Gastro-esophageal reflux disease without esophagitis: Secondary | ICD-10-CM | POA: Diagnosis not present

## 2016-01-13 DIAGNOSIS — Z1211 Encounter for screening for malignant neoplasm of colon: Secondary | ICD-10-CM | POA: Diagnosis not present

## 2016-01-17 ENCOUNTER — Ambulatory Visit
Admission: RE | Admit: 2016-01-17 | Discharge: 2016-01-17 | Disposition: A | Discharge status: Home / Self Care | Payer: Medicare Other | Source: Ambulatory Visit | Attending: Gastroenterology | Admitting: Gastroenterology

## 2016-01-17 DIAGNOSIS — K2100 Gastro-esophageal reflux disease with esophagitis, without bleeding: Secondary | ICD-10-CM

## 2016-01-17 DIAGNOSIS — R1310 Dysphagia, unspecified: Secondary | ICD-10-CM

## 2016-01-17 DIAGNOSIS — K219 Gastro-esophageal reflux disease without esophagitis: Secondary | ICD-10-CM | POA: Diagnosis not present

## 2016-02-08 DIAGNOSIS — Z23 Encounter for immunization: Secondary | ICD-10-CM | POA: Diagnosis not present

## 2016-02-22 DIAGNOSIS — R140 Abdominal distension (gaseous): Secondary | ICD-10-CM | POA: Diagnosis not present

## 2016-02-22 DIAGNOSIS — R1310 Dysphagia, unspecified: Secondary | ICD-10-CM | POA: Diagnosis not present

## 2016-02-22 DIAGNOSIS — K5900 Constipation, unspecified: Secondary | ICD-10-CM | POA: Diagnosis not present

## 2016-02-22 DIAGNOSIS — K219 Gastro-esophageal reflux disease without esophagitis: Secondary | ICD-10-CM | POA: Diagnosis not present

## 2016-02-23 ENCOUNTER — Encounter: Payer: Self-pay | Admitting: Family Medicine

## 2016-02-23 ENCOUNTER — Ambulatory Visit (INDEPENDENT_AMBULATORY_CARE_PROVIDER_SITE_OTHER): Payer: Medicare Other | Admitting: Family Medicine

## 2016-02-23 DIAGNOSIS — Z8639 Personal history of other endocrine, nutritional and metabolic disease: Secondary | ICD-10-CM

## 2016-02-23 DIAGNOSIS — K219 Gastro-esophageal reflux disease without esophagitis: Secondary | ICD-10-CM

## 2016-02-23 DIAGNOSIS — K5900 Constipation, unspecified: Secondary | ICD-10-CM

## 2016-02-23 MED ORDER — ESOMEPRAZOLE MAGNESIUM 40 MG PO CPDR: 40.0000 mg | DELAYED_RELEASE_CAPSULE | Freq: Two times a day (BID) | ORAL | 1 refills | Status: DC

## 2016-02-23 NOTE — Progress Notes (Signed)
  Carol Rumps, MD Phone: [phone]  Carol Stark is a 82 y.o. female who presents today for follow-up.  GERD: Patient saw GI. Had an EGD. Reports increased her Nexium to twice daily. Gets an occasional sour taste. No abdominal pain or blood in her stool. Saw GI in follow-up yesterday. They're going to continue twice daily Nexium until February.  Constipation: Patient notes this is quite a bit better. Notes occasionally has some constipation and straining. Some hard stools rarely. MiraLAX helps with this.  History of hyperlipidemia: Cholesterol elevated in the past though has not been on medications in a long time. Had her cholesterol checked prior to moving here earlier this year. She notes no chest pain or claudication.  PMH: nonsmoker.   ROS see history of present illness  Objective  Physical Exam Vitals:   02/23/16 1306  BP: 110/64  Pulse: 96  Temp: 98.2 F (36.8 C)    BP Readings from Last 3 Encounters:  02/23/16 110/64  12/31/15 120/74  12/27/15 120/70   Wt Readings from Last 3 Encounters:  02/23/16 114 lb 6.4 oz (51.9 kg)  12/31/15 116 lb (52.6 kg)  12/27/15 114 lb (51.7 kg)    Physical Exam  Constitutional: No distress.  Cardiovascular: Normal rate, regular rhythm and normal heart sounds.   Pulmonary/Chest: Effort normal and breath sounds normal.  Abdominal: Soft. Bowel sounds are normal. She exhibits no distension. There is no tenderness. There is no rebound and no guarding.  Skin: She is not diaphoretic.     Assessment/Plan: Please see individual problem list.  GERD (gastroesophageal reflux disease) Improved control. Continue Nexium. Continue to see GI.  Constipation Improved. Continue to monitor.  History of hyperlipidemia History of this in the past. Previously on medication. Has not been on medication in some time. Cholesterol checked earlier this year. Discussed rechecking after one year.   No orders of the defined types were  placed in this encounter.   Meds ordered this encounter  Medications  . esomeprazole (NEXIUM) 40 MG capsule    Sig: Take 1 capsule (40 mg total) by mouth 2 (two) times daily before a meal.    Dispense:  180 capsule    Refill:  [place], MD [place]

## 2016-02-23 NOTE — Assessment & Plan Note (Signed)
Improved control. Continue Nexium. Continue to see GI.

## 2016-02-23 NOTE — Assessment & Plan Note (Signed)
History of this in the past. Previously on medication. Has not been on medication in some time. Cholesterol checked earlier this year. Discussed rechecking after one year.

## 2016-02-23 NOTE — Progress Notes (Signed)
Pre visit review using our clinic review tool, if applicable. No additional management support is needed unless otherwise documented below in the visit note. 

## 2016-02-23 NOTE — Assessment & Plan Note (Signed)
Improved. Continue to monitor. 

## 2016-02-23 NOTE — Patient Instructions (Signed)
Nice to see you. Please continue your Nexium. Please continue your MiraLAX. We will plan on checking your cholesterol if needed next year.

## 2016-05-15 ENCOUNTER — Encounter: Payer: Self-pay | Admitting: Obstetrics and Gynecology

## 2016-05-26 DIAGNOSIS — H2513 Age-related nuclear cataract, bilateral: Secondary | ICD-10-CM | POA: Diagnosis not present

## 2016-05-29 DIAGNOSIS — K219 Gastro-esophageal reflux disease without esophagitis: Secondary | ICD-10-CM | POA: Diagnosis not present

## 2016-06-05 ENCOUNTER — Encounter: Payer: Self-pay | Admitting: Family Medicine

## 2016-06-05 ENCOUNTER — Ambulatory Visit (INDEPENDENT_AMBULATORY_CARE_PROVIDER_SITE_OTHER): Payer: Medicare Other | Admitting: Family Medicine

## 2016-06-05 DIAGNOSIS — K5900 Constipation, unspecified: Secondary | ICD-10-CM

## 2016-06-05 DIAGNOSIS — N952 Postmenopausal atrophic vaginitis: Secondary | ICD-10-CM | POA: Diagnosis not present

## 2016-06-05 DIAGNOSIS — J309 Allergic rhinitis, unspecified: Secondary | ICD-10-CM | POA: Diagnosis not present

## 2016-06-05 DIAGNOSIS — K219 Gastro-esophageal reflux disease without esophagitis: Secondary | ICD-10-CM | POA: Diagnosis not present

## 2016-06-05 MED ORDER — PREMARIN 0.625 MG/GM VA CREA: TOPICAL_CREAM | VAGINAL | 1 refills | Status: DC

## 2016-06-05 NOTE — Assessment & Plan Note (Signed)
Patient with history of allergies and notes some postnasal drip and hoarseness. No other respiratory symptoms. Discussed starting back on Flonase and monitoring her symptoms for progression.

## 2016-06-05 NOTE — Patient Instructions (Signed)
Nice to see you. Please start on your Flonase again. Please monitor your constipation or reflux.

## 2016-06-05 NOTE — Progress Notes (Signed)
  Tommi Rumps, MD Phone: [phone]  Carol Stark is a 83 y.o. female who presents today for f/u.  GERD: Patient notes no reflux symptoms. No blood in her stool. No abdominal pain. She saw GI and they decreased her dose of Nexium to 1 time daily.  Constipation: Patient notes this is significantly better. Has been using MiraLAX several times a week and has a normal bowel movement daily. No abdominal pain.  Patient reports she feels a little hoarse today. She wants me to look at her throat. She notes no upper respiratory congestion. Some slight postnasal drip. No body aches. No fevers. No cough. Does have sick contacts at her living facility.  Patient uses Premarin for vaginal atrophy and dryness. Notes this helps her vaginal tissue remain moist she reports she uses the Premarin once every couple of weeks. Notes no vaginal bleeding.  PMH: nonsmoker.   ROS see history of present illness  Objective  Physical Exam Vitals:   06/05/16 1324  BP: 130/66  Pulse: 88  Temp: 98.4 F (36.9 C)    BP Readings from Last 3 Encounters:  06/05/16 130/66  02/23/16 110/64  12/31/15 120/74   Wt Readings from Last 3 Encounters:  06/05/16 118 lb 3.2 oz (53.6 kg)  02/23/16 114 lb 6.4 oz (51.9 kg)  12/31/15 116 lb (52.6 kg)    Physical Exam  Constitutional: No distress.  HENT:  Head: Normocephalic and atraumatic.  Mouth/Throat: Oropharynx is clear and moist. No oropharyngeal exudate.  Bilateral TMs occluded by cerumen, after irrigation by CMA normal TMs noted  Eyes: Conjunctivae are normal. Pupils are equal, round, and reactive to light.  Cardiovascular: Normal rate, regular rhythm and normal heart sounds.   Pulmonary/Chest: Effort normal and breath sounds normal.  Abdominal: Soft. Bowel sounds are normal. She exhibits no distension. There is no tenderness. There is no rebound and no guarding.  Neurological: She is alert. Gait normal.  Skin: She is not diaphoretic.      Assessment/Plan: Please see individual problem list.  GERD (gastroesophageal reflux disease) Asymptomatic. Continue Nexium.  Constipation Much improved on MiraLAX. Continue as needed MiraLAX.  Allergic rhinitis Patient with history of allergies and notes some postnasal drip and hoarseness. No other respiratory symptoms. Discussed starting back on Flonase and monitoring her symptoms for progression.  Postmenopausal atrophic vaginitis She reports history of postmenopausal atrophic vaginitis that is well controlled with Premarin cream used once every 2 weeks. We will refill this. We'll have her return for a physical in the next several months.   No orders of the defined types were placed in this encounter.   Meds ordered this encounter  Medications  . PREMARIN vaginal cream    Sig: Place vaginally 2 (two) times a week.    Dispense:  30 g    Refill:  [place], MD [place]

## 2016-06-05 NOTE — Progress Notes (Signed)
Pre visit review using our clinic review tool, if applicable. No additional management support is needed unless otherwise documented below in the visit note. 

## 2016-06-05 NOTE — Assessment & Plan Note (Signed)
Asymptomatic.  Continue Nexium. 

## 2016-06-05 NOTE — Assessment & Plan Note (Addendum)
She reports history of postmenopausal atrophic vaginitis that is well controlled with Premarin cream used once every 2 weeks. We will refill this. We'll have her return for a physical in the next several months.

## 2016-06-05 NOTE — Assessment & Plan Note (Signed)
Much improved on MiraLAX. Continue as needed MiraLAX.

## 2016-06-29 ENCOUNTER — Ambulatory Visit (INDEPENDENT_AMBULATORY_CARE_PROVIDER_SITE_OTHER): Payer: Medicare Other | Admitting: Family Medicine

## 2016-06-29 ENCOUNTER — Encounter: Payer: Self-pay | Admitting: Family Medicine

## 2016-06-29 DIAGNOSIS — H8110 Benign paroxysmal vertigo, unspecified ear: Secondary | ICD-10-CM | POA: Insufficient documentation

## 2016-06-29 DIAGNOSIS — H8112 Benign paroxysmal vertigo, left ear: Secondary | ICD-10-CM

## 2016-06-29 NOTE — Assessment & Plan Note (Signed)
Symptoms and exam consistent with BPPV. She had positive Dix-Hallpike testing on the left. Discussed the nature of this issue with her. Advised that it was benign. Advised on modified Epley maneuver. She'll monitor and if not improving by Monday she'll let us know. If she develops any new symptoms she'll be reevaluated.

## 2016-06-29 NOTE — Progress Notes (Signed)
  Tommi Rumps, MD Phone: [phone]  Carol Stark is a 83 y.o. female who presents today for same-day visit.  Patient notes onset of positional vertiginous symptoms 4-5 days ago. Started when she was laying in bed and then started to get up. She notes the vertigo resolves quickly. Only occurs when lying down. She's had minimal nausea with this. No vomiting or diarrhea. No hearing loss. No tinnitus. May be some fullness in her ears. She has never had symptoms like this before.  ROS see history of present illness  Objective  Physical Exam Vitals:   06/29/16 1109  BP: 114/68  Pulse: 81  Temp: 97.9 F (36.6 C)    BP Readings from Last 3 Encounters:  06/29/16 114/68  06/05/16 130/66  02/23/16 110/64   Wt Readings from Last 3 Encounters:  06/29/16 118 lb 6.4 oz (53.7 kg)  06/05/16 118 lb 3.2 oz (53.6 kg)  02/23/16 114 lb 6.4 oz (51.9 kg)    Physical Exam  Constitutional: No distress.  HENT:  Head: Normocephalic and atraumatic.  Mouth/Throat: Oropharynx is clear and moist. No oropharyngeal exudate.  Normal TMs bilaterally  Eyes: Conjunctivae are normal. Pupils are equal, round, and reactive to light.  Cardiovascular: Normal rate, regular rhythm and normal heart sounds.   Pulmonary/Chest: Effort normal and breath sounds normal.  Musculoskeletal: She exhibits no edema.  Neurological: She is alert. Gait normal.  CN 2-12 intact, 5/5 strength in bilateral biceps, triceps, grip, quads, hamstrings, plantar and dorsiflexion, sensation to light touch intact in bilateral UE and LE, normal gait, 2+ patellar reflexes  Skin: Skin is warm and dry. She is not diaphoretic.   horizontal nystagmus on Dix-Hallpike testing on the left with vertigo   Assessment/Plan: Please see individual problem list.  BPPV (benign paroxysmal positional vertigo) Symptoms and exam consistent with BPPV. She had positive Dix-Hallpike testing on the left. Discussed the nature of this issue with her.  Advised that it was benign. Advised on modified Epley maneuver. She'll monitor and if not improving by Monday she'll let us know. If she develops any new symptoms she'll be reevaluated.   Tommi Rumps, MD [place]

## 2016-06-29 NOTE — Progress Notes (Signed)
Pre visit review using our clinic review tool, if applicable. No additional management support is needed unless otherwise documented below in the visit note. 

## 2016-06-29 NOTE — Patient Instructions (Signed)
Nice to see you. You have BPPV. We will treat this with a modified Epley maneuver. If you have vertigo that does not resolve quickly or you develop any new symptoms please seek medical attention immediately. If you have not improved over the next week please let us know.   Benign Positional Vertigo Vertigo is the feeling that you or your surroundings are moving when they are not. Benign positional vertigo is the most common form of vertigo. The cause of this condition is not serious (is benign). This condition is triggered by certain movements and positions (is positional). This condition can be dangerous if it occurs while you are doing something that could endanger you or others, such as driving. What are the causes? In many cases, the cause of this condition is not known. It may be caused by a disturbance in an area of the inner ear that helps your brain to sense movement and balance. This disturbance can be caused by a viral infection (labyrinthitis), head injury, or repetitive motion. What increases the risk? This condition is more likely to develop in:  Women.  People who are 33 years of age or older. What are the signs or symptoms? Symptoms of this condition usually happen when you move your head or your eyes in different directions. Symptoms may start suddenly, and they usually last for less than a minute. Symptoms may include:  Loss of balance and falling.  Feeling like you are spinning or moving.  Feeling like your surroundings are spinning or moving.  Nausea and vomiting.  Blurred vision.  Dizziness.  Involuntary eye movement (nystagmus). Symptoms can be mild and cause only slight annoyance, or they can be severe and interfere with daily life. Episodes of benign positional vertigo may return (recur) over time, and they may be triggered by certain movements. Symptoms may improve over time. How is this diagnosed? This condition is usually diagnosed by medical history and a  physical exam of the head, neck, and ears. You may be referred to a health care provider who specializes in ear, nose, and throat (ENT) problems (otolaryngologist) or a provider who specializes in disorders of the nervous system (neurologist). You may have additional testing, including:  MRI.  A CT scan.  Eye movement tests. Your health care provider may ask you to change positions quickly while he or she watches you for symptoms of benign positional vertigo, such as nystagmus. Eye movement may be tested with an electronystagmogram (ENG), caloric stimulation, the Dix-Hallpike test, or the roll test.  An electroencephalogram (EEG). This records electrical activity in your brain.  Hearing tests. How is this treated? Usually, your health care provider will treat this by moving your head in specific positions to adjust your inner ear back to normal. Surgery may be needed in severe cases, but this is rare. In some cases, benign positional vertigo may resolve on its own in 2-4 weeks. Follow these instructions at home: Safety   Move slowly.Avoid sudden body or head movements.  Avoid driving.  Avoid operating heavy machinery.  Avoid doing any tasks that would be dangerous to you or others if a vertigo episode would occur.  If you have trouble walking or keeping your balance, try using a cane for stability. If you feel dizzy or unstable, sit down right away.  Return to your normal activities as told by your health care provider. Ask your health care provider what activities are safe for you. General instructions   Take over-the-counter and prescription medicines only as  told by your health care provider.  Avoid certain positions or movements as told by your health care provider.  Drink enough fluid to keep your urine clear or pale yellow.  Keep all follow-up visits as told by your health care provider. This is important. Contact a health care provider if:  You have a fever.  Your  condition gets worse or you develop new symptoms.  Your family or friends notice any behavioral changes.  Your nausea or vomiting gets worse.  You have numbness or a "pins and needles" sensation. Get help right away if:  You have difficulty speaking or moving.  You are always dizzy.  You faint.  You develop severe headaches.  You have weakness in your legs or arms.  You have changes in your hearing or vision.  You develop a stiff neck.  You develop sensitivity to light. This information is not intended to replace advice given to you by your health care provider. Make sure you discuss any questions you have with your health care provider. Document Released: 01/02/2006 Document Revised: 09/02/2015 Document Reviewed: 07/20/2014 Elsevier Interactive Patient Education  2017 [employer].

## 2016-06-30 ENCOUNTER — Telehealth: Payer: Self-pay | Admitting: Family Medicine

## 2016-06-30 DIAGNOSIS — H8112 Benign paroxysmal vertigo, left ear: Secondary | ICD-10-CM

## 2016-06-30 NOTE — Telephone Encounter (Signed)
Patient came to facility wanting to speak with PCP , patient was advised PCP schedule full. Patient talked with triage nurse and advised nurse due to degenerative disc disease she could not perform exercise prescribed by PCP, patient was DX by former PCP in [place]. Dr. Anastasio Auerbach, per patient . Patient ask for new exercise to be prescribed that she is not to tilt head backwards.

## 2016-06-30 NOTE — Telephone Encounter (Signed)
If patient is unable to do the exercises that were provided I would suggest referral for vestibular rehabilitation. I'll place this referral. Thanks.

## 2016-06-30 NOTE — Telephone Encounter (Signed)
Patient notified and voiced understanding, patient agrees with referral.

## 2016-07-05 ENCOUNTER — Telehealth: Payer: Self-pay | Admitting: Family Medicine

## 2016-07-05 NOTE — Telephone Encounter (Signed)
Pt called looking for an update on the PT vestibular rehab order that Dr. Caryl Bis put in. Please advise, thank you!  Call pt @ [phone]

## 2016-07-11 ENCOUNTER — Ambulatory Visit: Payer: Medicare Other | Attending: Family Medicine

## 2016-07-11 VITALS — BP 142/80 | HR 91

## 2016-07-11 DIAGNOSIS — H8111 Benign paroxysmal vertigo, right ear: Secondary | ICD-10-CM | POA: Insufficient documentation

## 2016-07-11 DIAGNOSIS — R42 Dizziness and giddiness: Secondary | ICD-10-CM | POA: Diagnosis not present

## 2016-07-11 NOTE — Therapy (Signed)
[place] MAIN [facility] SERVICES [address] [place], [place], [address] Phone: [phone]   Fax:  [phone]  Physical Therapy Evaluation  Patient Details  Name: Carol Stark MRN: [id number] Date of Birth: 1931/07/27 Referring Provider: Dr. Caryl Bis  Encounter Date: 07/11/2016      PT End of Session - 07/11/16 1409    Visit Number 1   Number of Visits 13   Date for PT Re-Evaluation 09/03/16   Authorization Type g codes 1/10   PT Start Time 1300   PT Stop Time 1420   PT Time Calculation (min) 80 min   Activity Tolerance Patient tolerated treatment well   Behavior During Therapy [facility] for tasks assessed/performed      Past Medical History:  Diagnosis Date  . Allergic rhinitis   . Arthritis   . History of chicken pox   . UTI (lower urinary tract infection)     Past Surgical History:  Procedure Laterality Date  . TONSILLECTOMY      Vitals:   07/11/16 1304  BP: (!) 142/80  Pulse: 91  SpO2: 100%         Subjective Assessment - 07/11/16 1406    Subjective Vertigo   Pertinent History Pt reports short duration vertigo when sitting up or laying down in bed. Worse when rolling to the left side but not exclusive to that direction. She describes oscillopsia when symptoms occur. Symptoms last "a couple seconds" and then go away. She saw Dr. Caryl Bis who performed Dix-Hallpike testing and noted horizontal nystagmus. She was referred for presumed BPPV.     Limitations Other (comment)  Rolling in bed   Diagnostic tests None reported   Patient Stated Goals Resolution of vertigo when rolling in bed   Currently in Pain? No/denies            [facility] PT Assessment - 07/11/16 1340      Assessment   Medical Diagnosis Vertigo   Referring Provider Dr. Caryl Bis   Onset Date/Surgical Date 06/27/16  Approximate   Hand Dominance Right   Next MD Visit None reported   Prior Therapy None     Precautions   Precautions None      Restrictions   Weight Bearing Restrictions No     Balance Screen   Has the patient fallen in the past 6 months No   Has the patient had a decrease in activity level because of a fear of falling?  No   Is the patient reluctant to leave their home because of a fear of falling?  No     Home Environment   Living Environment Other (Comment)  Senior living independent apartment   Living Arrangements Alone   Available Help at Discharge Friend(s)   Type of Home Apartment   Home Access Level entry  Ground floor   Home Layout One level     Prior Function   Level of Independence Independent   Vocation Retired     Associate Professor   Overall Cognitive Status Within Functional Limits for tasks assessed     Observation/Other Assessments   Other Surveys  Other Surveys   Activities of Balance Confidence Scale (ABC Scale)  97.5%   Dizziness Handicap Inventory ([place])  18/100     Sensation   Additional Comments Denies N/T in bilateral UE/LE         VESTIBULAR AND BALANCE EVALUATION  Onset Date: "A couple weeks"  HISTORY:  Subjective history of current problem: Pt reports short duration  vertigo when sitting up or laying down in bed. Worse when rolling to the left side but not exclusive to that direction. She describes oscillopsia when symptoms occur. Symptoms last "a couple seconds" and then go away. She saw Dr. Caryl Bis who performed Dix-Hallpike testing and noted horizontal nystagmus. She was referred for presumed BPPV.   Description of dizziness: (vertigo, unsteadiness, lightheadedness, falling, general unsteadiness, aural fullness) vertigo Frequency: Only with aggravating positions Duration: "a few seconds" Symptom nature: (motion provoked/positional/spontaneous/constant, variable, intermittent): Intermittent    Provocative Factors: Laying down and sitting up in bed Easing Factors: waiting until symptoms resolved  Progression of symptoms: (better, worse, no change since onset): no  change History of similar episodes: None  Falls (yes/no): No Number of falls in past 6 months: No   Prior Functional Level: Fully independent  Auditory complaints (tinnitus, pain, drainage): Tinnitis L ear once/day, new onset.  Vision (last eye exam, diplopia, recent changes): No changes. Pt wears corrective lenses. Plans to have cataract surgery   Current Symptoms: vertigo, nausea, weight loss (10# over 8 months), oscillopsia; Denies: dysarthria, dysphagia, drop attacks, bowel and bladder changes, lightheadedness, headache, rocking, general unsteadiness, imbalance, tilting, swaying, veering; Review of systems negative for red flags.   EXAMINATION  POSTURE: Forward head and rounded shoulders but relatively age appropriate  NEUROLOGICAL SCREEN: Deferred at this time.    SOMATOSENSORY:         Sensation           Intact      Diminished         Absent  Light touch WNL    Any N & T in extremities or weakness: None      COORDINATION:  Finger to Nose:  Dysmetric  /  Normal Past Pointing:   Left  /  Right  /  Normal Pronator Drift:    MUSCULOSKELETAL SCREEN: Cervical Spine ROM: Limited extension and lateral flexion. "Stiffness" reported but no pain. Pt with history of neck pain   ROM:WNL  MMT: Deferred strength testing. Pt functionally strong, able to perform sit to stand without UE support.   Functional Mobility: Independent for ambulation without assistive device  Gait: Scanning of visual environment with gait is: no deficits in scanning noted  Balance: Pt denies falls. Further balance screening deferred until BPPV has been treated  POSTURAL CONTROL TESTS: Deferred   OCULOMOTOR / VESTIBULAR TESTING: Deferred most of tests due to positive BPPV testing  Oculomotor Exam- Room Light  Normal Abnormal Comments  Ocular Alignment N    Ocular ROM N    Spontaneous Nystagmus N    End-Gaze Nystagmus   Deferred  Smooth Pursuit   Deferred  Saccades   Deferred  VOR   Deferred   VOR Cancellation   Deferred  Left Head Thrust   Deferred  Right Head Thrust   Deferred  Head Shaking Nystagmus   Deferred  Static Acuity     Dynamic Acuity       BPPV TESTS:  Symptoms Duration Intensity Nystagmus  L Dix-Hallpike Vertigo 45s 6/10 Initially upbeating L torsional converts to pure horizontal ageotrophic  R Dix-Hallpike Negative   Negative  L Head Roll Vertigo 90s 8/10 Horizontal pure ageotrophic  R Head Roll "Dizziness" 20s 7/10 Horizontal pure ageotrophic  L Sidelying Test      R Sidelying Test        FUNCTIONAL OUTCOME MEASURES:  Results Comments  DHI 18/100   ABC Scale 97.5%   DGI    10 meter  Walking Speed        CRT Performed log roll with patient x 3 to treat presumed R horizontal canal BPPV (no history of posterior canal to indicate involved ear, per convention with ageotrophic nystagmus treated side with weaker symptoms/nystagmus). Pt held in each position for 2 minutes during first and second CRT. During second treatment pt reports 4/10 vertigo with ageotrophic nystagmus of 60 seconds duration during first position (R head roll). She has no further vertigo or nystagmus in all other positions. With third CRT pt is completely negative for symptoms and nystagmus in all positions so each position only held for 1 minute. Pt provided extensive education regarding BPPV.                                PT Education - 07/11/16 1408    Education provided Yes   Education Details Plan of care, VEDA BPPV handout when extensive education from therapist   Person(s) Educated Patient   Methods Explanation;Handout   Comprehension Verbalized understanding             PT Long Term Goals - 07/11/16 1403      PT LONG TERM GOAL #1   Title Pt will be independent with home log rolling technique in order to treat horizontal canal BPPV at home   Time 6   Period Weeks   Status New     PT LONG TERM GOAL #2   Title Pt will decrease DHI score to  0/100 in order to demonstrate clinically significant reduction in disability    Baseline 07/11/16: 18/100   Time 6   Period Weeks   Status New     PT LONG TERM GOAL #3   Title Pt will report no further episodes of vertigo when rolling in bed   Baseline 07/11/16: vertigo when rolling in bed, primarily to the L   Time 6   Period Weeks   Status New               Plan - 07/11/16 1409    Clinical Impression Statement Pt is a pleasant 83 yo female referred for BPPV. PT examination reveals ageotrophic horizontal nystagmus during L Dix-Hallpike testing. Tested horizontal canal with head roll and pt with pure horizontal ageotrophic nystamgus with both left and right testing as would be expected with horizontal canal BPPV. Per convention with ageotrophic nystagmus treated the side with the weaker symptoms/nystagmus which is the R ear. Pt taken through 3 rounds of log roll canalith repositioning manuever with complete resolution of her symptoms after the second CRT. She will follow-up for further CRT until symptoms have completely resolved. Once symptoms have resolved will address any remaining issues related to dizziness or balance dysfunction. Pt will benefit from skilled PT services to address deficits related to dizziness/vertigo in order to improve her function at home.    Rehab Potential Excellent   Clinical Impairments Affecting Rehab Potential Positive: motivation, positive response to log roll; Negative: age   PT Frequency 2x / week   PT Duration 6 weeks   PT Treatment/Interventions Canalith Repostioning;Therapeutic activities;Therapeutic exercise;Balance training;Neuromuscular re-education;Patient/family education;Manual techniques;Passive range of motion;Vestibular   PT Next Visit Plan Repeat roll test, if negative repeat Dix-Hallpike testing, perform CRT as inidicated based on BPPV testing, full oculomotor examination once clear, screen balance if appropriate after BPPV is clear   PT Home  Exercise Plan None currently   Consulted and Agree with  Plan of Care Patient      Patient will benefit from skilled therapeutic intervention in order to improve the following deficits and impairments:  Dizziness  Visit Diagnosis: BPPV (benign paroxysmal positional vertigo), right - Plan: PT plan of care cert/re-cert  Dizziness and giddiness - Plan: PT plan of care cert/re-cert      G-Codes - 55/20/80 1405    Functional Assessment Tool Used (Outpatient Only) clinical judgement, DHI, ABC   Functional Limitation Mobility: Walking and moving around   Mobility: Walking and Moving Around Current Status (E2336) At least 1 percent but less than 20 percent impaired, limited or restricted   Mobility: Walking and Moving Around Goal Status (P2244) 0 percent impaired, limited or restricted       Problem List Patient Active Problem List   Diagnosis Date Noted  . BPPV (benign paroxysmal positional vertigo) 06/29/2016  . Allergic rhinitis 06/05/2016  . Postmenopausal atrophic vaginitis 06/05/2016  . History of hyperlipidemia 02/23/2016  . Loose stools 12/27/2015  . GERD (gastroesophageal reflux disease) 11/08/2015  . Basal cell carcinoma 10/25/2015  . Unintentional weight loss 10/22/2015  . Constipation 10/22/2015   Phillips Grout PT, DPT   Huprich,Jason 07/11/2016, 5:25 PM  [place] MAIN [facility] SERVICES [address] [place], [place], [address] Phone: [phone]   Fax:  [phone]  Name: Georjean Toya MRN: [id number] Date of Birth: 30-Oct-1931

## 2016-07-14 ENCOUNTER — Ambulatory Visit: Payer: Medicare Other

## 2016-07-14 VITALS — BP 142/73 | HR 96

## 2016-07-14 DIAGNOSIS — H8111 Benign paroxysmal vertigo, right ear: Secondary | ICD-10-CM | POA: Diagnosis not present

## 2016-07-14 DIAGNOSIS — R42 Dizziness and giddiness: Secondary | ICD-10-CM

## 2016-07-14 NOTE — Therapy (Signed)
[place] MAIN [facility] SERVICES [address] [place], [place], [address] Phone: [phone]   Fax:  [phone]  Physical Therapy Treatment/Discharge  Patient Details  Name: Carol Stark MRN: [id number] Date of Birth: Apr 25, 1931 Referring Provider: Dr. Caryl Bis  Encounter Date: 07/14/2016      PT End of Session - 07/14/16 July 16, 1200    Visit Number 2   Number of Visits 13   Date for PT Re-Evaluation 08/31/2016   Authorization Type g codes 05-29-22   PT Start Time 0915   PT Stop Time 1000   PT Time Calculation (min) 45 min   Activity Tolerance Patient tolerated treatment well   Behavior During Therapy [facility] for tasks assessed/performed      Past Medical History:  Diagnosis Date  . Allergic rhinitis   . Arthritis   . History of chicken pox   . UTI (lower urinary tract infection)     Past Surgical History:  Procedure Laterality Date  . TONSILLECTOMY      Vitals:   07/14/16 0918  BP: (!) 142/73  Pulse: 96  SpO2: 97%        Subjective Assessment - 07/14/16 0917    Subjective Pt reports that she has not had any further episodes of vertigo. She does complain of some slight dizziness today but reports last night she felt completely better. No pain today. No specific questions or concerns.    Pertinent History Pt reports short duration vertigo when sitting up or laying down in bed. Worse when rolling to the left side but not exclusive to that direction. She describes oscillopsia when symptoms occur. Symptoms last "a couple seconds" and then go away. She saw Dr. Caryl Bis who performed Dix-Hallpike testing and noted horizontal nystagmus. She was referred for presumed BPPV.     Limitations Other (comment)  Rolling in bed   Diagnostic tests None reported   Patient Stated Goals Resolution of vertigo when rolling in bed   Currently in Pain? No/denies            [facility] PT Assessment - 07/14/16 0942      Standardized Balance Assessment    Standardized Balance Assessment Berg Balance Test;Dynamic Gait Index     Berg Balance Test   Sit to Stand Able to stand without using hands and stabilize independently   Standing Unsupported Able to stand safely 2 minutes   Sitting with Back Unsupported but Feet Supported on Floor or Stool Able to sit safely and securely 2 minutes   Stand to Sit Sits safely with minimal use of hands   Transfers Able to transfer safely, minor use of hands   Standing Unsupported with Eyes Closed Able to stand 10 seconds safely   Standing Ubsupported with Feet Together Able to place feet together independently and stand 1 minute safely   From Standing, Reach Forward with Outstretched Arm Can reach forward >12 cm safely (5")   From Standing Position, Pick up Object from Floor Able to pick up shoe safely and easily   From Standing Position, Turn to Look Behind Over each Shoulder Looks behind from both sides and weight shifts well   Turn 360 Degrees Able to turn 360 degrees safely in 4 seconds or less   Standing Unsupported, Alternately Place Feet on Step/Stool Able to stand independently and safely and complete 8 steps in 20 seconds   Standing Unsupported, One Foot in [place] to place foot tandem independently and hold 30 seconds   Standing on One  Leg Able to lift leg independently and hold > 10 seconds   Total Score 55     Dynamic Gait Index   Level Surface Normal   Change in Gait Speed Normal   Gait with Horizontal Head Turns Normal   Gait with Vertical Head Turns Mild Impairment   Gait and Pivot Turn Normal   Step Over Obstacle Normal   Step Around Obstacles Normal   Steps Mild Impairment   Total Score 22        NEUROMUSCULAR RE-EDUCATION  Re-tested L and R head roll today. No vertigo or dizziness reported and no nystagmus observed. Negative test bilaterally Tested L and R posterior canal with Dix-Hallpike. No vertigo or dizziness reported and no nystagmus observed. Negative test bilaterally.   Performed additional vestibular/neurological screen (see below) Performed DGI and BERG with patient; Pt provided instructions with education about how to perform BBQ/log roll for R horizontal canal BPPV at home.   NEUROLOGICAL SCREEN: (2+ unless otherwise noted.) N=normal  Ab=abnormal  Level Dermatome R L Myotome R L Reflex R L  C3 Anterior Neck N N Sidebend C2-3 N N Jaw CN V    C4 Top of Shoulder N N Shoulder Shrug C4 N N Hoffman's UMN    C5 Lateral Upper Arm N N Shoulder ABD C4-5 N N Biceps C5-6    C6 Lateral Arm/ Thumb N N Arm Flex/ Wrist Ext C5-6 N N Brachiorad. C5-6    C7 Middle Finger N N Arm Ext//Wrist Flex C6-7 N N Triceps C7    C8 4th & 5th Finger N N Flex/ Ext Carpi Ulnaris C8 N N Patella    T1 Medial Arm N N Interossei T1 N N Gastrocnemius    L2 Medial thigh/groin N N Illiopsoas (L2-3) N N     L3 Lower thigh/med.knee N N Quadriceps (L3-4) N N Patellar (L3-4)    L4 Medial leg/lat thigh N N Tibialis Ant (L4-5) N N     L5 Lat. leg & dorsal foot N N EHL (L5) N N     S1 post/lat foot/thigh/leg N N Gastrocnemius (S1-2) N N Gastrocnemius (S1)    S2 Post./med. thigh & leg N N Hamstrings (L4-S3) N N Babinski      SOMATOSENSORY:         Sensation           Intact      Diminished         Absent  Light touch Normal    Any N & T in extremities or weakness: Denies      COORDINATION: Finger to Nose: Normal Pronator Drift: Normal  POSTURAL CONTROL TESTS:   Clinical Test of Sensory Interaction for Balance    (CTSIB):  CONDITION TIME STRATEGY SWAY  Eyes open, firm surface 30s Ankle 1+  Eyes closed, firm surface 30s Ankle 1+  Eyes open, foam surface 30s Ankle 1+  Eyes closed, foam surface 30s Ankle 2+    OCULOMOTOR / VESTIBULAR TESTING:  Oculomotor Exam- Room Light  Normal Abnormal Comments  Ocular Alignment N    Ocular ROM N    Spontaneous Nystagmus N    End-Gaze Nystagmus N    Smooth Pursuit N    Saccades N    VOR N    VOR Cancellation N    Left Head Thrust N     Right Head Thrust  A Consistent corrective saccade required, no symptoms reported  Head Shaking Nystagmus N    Static Acuity  Dynamic Acuity        BPPV TESTS:  Symptoms Duration Intensity Nystagmus  L Dix-Hallpike No   No  R Dix-Hallpike No   No  L Head Roll No   No  R Head Roll No   No  L Sidelying Test      R Sidelying Test        FUNCTIONAL OUTCOME MEASURES:  Results Comments  DHI    ABC Scale    DGI 22/24 WNL  BERG 55/56 WNL                                  PT Education - 07-27-16 1201    Education provided Yes   Education Details Home BBQ/log roll for R horizontal BPPV, discharge, indications for return if needed   Person(s) Educated Patient   Methods Explanation;Handout   Comprehension Verbalized understanding             PT Long Term Goals - 07-27-16 1212      PT LONG TERM GOAL #1   Title Pt will be independent with home log rolling technique in order to treat horizontal canal BPPV at home   Time 6   Period Weeks   Status Achieved     PT LONG TERM GOAL #2   Title Pt will decrease DHI score to 0/100 in order to demonstrate clinically significant reduction in disability    Baseline 07/11/16: 18/100   Time 6   Period Weeks   Status Deferred     PT LONG TERM GOAL #3   Title Pt will report no further episodes of vertigo when rolling in bed   Baseline 07/11/16: vertigo when rolling in bed, primarily to the L   Time 6   Period Weeks   Status Achieved               Plan - 07/27/2016 1203    Clinical Impression Statement Pt reports complete resolution of her symptoms on this date with no recurrence after the initial evaluation/treatment. Head roll test for horizontal canal BPPV is negative bilaterally on this date. Dix-Hallpike test for posterior canal BPPV is also negative bilaterally. No further notable findings with oculomotor/vestibular testing. Pt with good balance scoring 22/24 on DGI and 55/56 on BERG. No further  PT needs identified at this time. Pt will be discharged on this date due to complete resolution of her symptoms. She was provided instructions about how to perform BBQ/log roll at home for R horizontal canal BPPV. Pt provided guidance about how to return for additional vestibular therapy if symptoms recur and do not respond to home management.    Rehab Potential Excellent   Clinical Impairments Affecting Rehab Potential Positive: motivation, positive response to log roll; Negative: age   PT Frequency 2x / week   PT Duration 6 weeks   PT Treatment/Interventions Canalith Repostioning;Therapeutic activities;Therapeutic exercise;Balance training;Neuromuscular re-education;Patient/family education;Manual techniques;Passive range of motion;Vestibular   PT Next Visit Plan Discharge   PT Home Exercise Plan Home instructions for BBQ/log roll for R horizontal canal BPPV   Consulted and Agree with Plan of Care Patient      Patient will benefit from skilled therapeutic intervention in order to improve the following deficits and impairments:  Dizziness  Visit Diagnosis: BPPV (benign paroxysmal positional vertigo), right  Dizziness and giddiness       G-Codes - Jul 27, 2016 1212    Functional Assessment Tool Used (  Outpatient Only) clinical judgement   Functional Limitation Mobility: Walking and moving around   Mobility: Walking and Moving Around Goal Status [phone]) 0 percent impaired, limited or restricted   Mobility: Walking and Moving Around Discharge Status [phone]) 0 percent impaired, limited or restricted      Problem List Patient Active Problem List   Diagnosis Date Noted  . BPPV (benign paroxysmal positional vertigo) 06/29/2016  . Allergic rhinitis 06/05/2016  . Postmenopausal atrophic vaginitis 06/05/2016  . History of hyperlipidemia 02/23/2016  . Loose stools 12/27/2015  . GERD (gastroesophageal reflux disease) 11/08/2015  . Basal cell carcinoma 10/25/2015  . Unintentional weight loss  10/22/2015  . Constipation 10/22/2015   Phillips Grout PT, DPT   Huprich,Jason 07/14/2016, 12:14 PM  [place] MAIN [facility] SERVICES [address] [place], [place], [address] Phone: [phone]   Fax:  [phone]  Name: Carol Stark MRN: [id number] Date of Birth: 04/26/1931

## 2016-07-18 ENCOUNTER — Encounter: Payer: Medicare Other | Admitting: Physical Therapy

## 2016-07-20 ENCOUNTER — Telehealth: Payer: Self-pay | Admitting: Family Medicine

## 2016-07-20 NOTE — Telephone Encounter (Signed)
Pt is requesting to have her PREMARIN vaginal cream refilled. Her pharmacy is [facility] on [address].

## 2016-07-21 ENCOUNTER — Encounter: Payer: Medicare Other | Admitting: Physical Therapy

## 2016-07-21 ENCOUNTER — Other Ambulatory Visit: Payer: Self-pay

## 2016-07-21 MED ORDER — PREMARIN 0.625 MG/GM VA CREA: TOPICAL_CREAM | VAGINAL | 1 refills | Status: DC

## 2016-07-21 NOTE — Telephone Encounter (Signed)
Medication has been refilled as per protocol.

## 2016-07-25 ENCOUNTER — Encounter: Payer: Medicare Other | Admitting: Physical Therapy

## 2016-08-01 ENCOUNTER — Encounter: Payer: Medicare Other | Admitting: Physical Therapy

## 2016-08-08 ENCOUNTER — Encounter: Payer: Medicare Other | Admitting: Physical Therapy

## 2016-08-15 ENCOUNTER — Encounter: Payer: Medicare Other | Admitting: Physical Therapy

## 2016-10-09 ENCOUNTER — Ambulatory Visit (INDEPENDENT_AMBULATORY_CARE_PROVIDER_SITE_OTHER): Payer: Medicare Other | Admitting: Family Medicine

## 2016-10-09 ENCOUNTER — Encounter: Payer: Self-pay | Admitting: Family Medicine

## 2016-10-09 VITALS — BP 110/68 | HR 94 | Temp 98.2°F | Wt 120.0 lb

## 2016-10-09 DIAGNOSIS — R300 Dysuria: Secondary | ICD-10-CM

## 2016-10-09 DIAGNOSIS — K5900 Constipation, unspecified: Secondary | ICD-10-CM

## 2016-10-09 DIAGNOSIS — N3001 Acute cystitis with hematuria: Secondary | ICD-10-CM

## 2016-10-09 DIAGNOSIS — K219 Gastro-esophageal reflux disease without esophagitis: Secondary | ICD-10-CM | POA: Diagnosis not present

## 2016-10-09 DIAGNOSIS — N390 Urinary tract infection, site not specified: Secondary | ICD-10-CM | POA: Insufficient documentation

## 2016-10-09 LAB — POCT URINALYSIS DIPSTICK
BILIRUBIN UA: NEGATIVE
GLUCOSE UA: NEGATIVE
KETONES UA: NEGATIVE
Nitrite, UA: NEGATIVE
PROTEIN UA: NEGATIVE
SPEC GRAV UA: 1.015 (ref 1.010–1.025)
Urobilinogen, UA: 0.2 E.U./dL
pH, UA: 6 (ref 5.0–8.0)

## 2016-10-09 LAB — URINALYSIS, MICROSCOPIC ONLY

## 2016-10-09 MED ORDER — CEPHALEXIN 500 MG PO CAPS: 500.0000 mg | ORAL_CAPSULE | Freq: Two times a day (BID) | ORAL | 0 refills | Status: DC

## 2016-10-09 NOTE — Progress Notes (Signed)
  Tommi Rumps, MD Phone: [phone]  Carol Stark is a 83 y.o. female who presents today for follow-up.  Dysuria: Patient notes over the last week and a half she's had urinary frequency, urgency, and dysuria. She has no fever or abdominal pain. Has no change to her chronic normal vaginal discharge. She started using Premarin last night as it usually does help her.  GERD: Taking Nexium. This does help. She's been taking Tums prior to eating to try to prevent symptoms. If she doesn't take the Tums she does occasionally get burning and reflux symptoms. No blood in her stool. No abdominal pain.  Constipation: Notes this is not an issue as long she takes MiraLAX. Has daily normal bowel movements with MiraLAX. No blood or abdominal pain.  PMH: nonsmoker.   ROS see history of present illness  Objective  Physical Exam Vitals:   10/09/16 1316  BP: 110/68  Pulse: 94  Temp: 98.2 F (36.8 C)    BP Readings from Last 3 Encounters:  10/09/16 110/68  07/14/16 (!) 142/73  07/11/16 (!) 142/80   Wt Readings from Last 3 Encounters:  10/09/16 120 lb (54.4 kg)  06/29/16 118 lb 6.4 oz (53.7 kg)  06/05/16 118 lb 3.2 oz (53.6 kg)    Physical Exam  Constitutional: No distress.  Cardiovascular: Normal rate, regular rhythm and normal heart sounds.   Pulmonary/Chest: Effort normal and breath sounds normal.  Abdominal: Soft. Bowel sounds are normal. She exhibits no distension. There is no tenderness. There is no rebound and no guarding.  Neurological: She is alert. Gait normal.  Skin: Skin is warm and dry. She is not diaphoretic.     Assessment/Plan: Please see individual problem list.  UTI (urinary tract infection) UA and symptoms most consistent with UTI. We'll treat with Keflex. Send urine for culture and microscopy.  GERD (gastroesophageal reflux disease) She is having some symptoms. Discussed the appropriate time to take her Nexium. She'll start taking this 30 minutes prior  to breakfast. She'll contact us in 2 weeks if continuing to have symptoms with this.  Constipation Continue MiraLAX. Monitor for recurrence.   Orders Placed This Encounter  Procedures  . Urine Culture  . Urine Microscopic Only  . POCT urinalysis dipstick    Meds ordered this encounter  Medications  . calcium carbonate (TUMS - DOSED IN MG ELEMENTAL CALCIUM) 500 MG chewable tablet    Sig: Chew 1 tablet by mouth daily.  . cephALEXin (KEFLEX) 500 MG capsule    Sig: Take 1 capsule (500 mg total) by mouth 2 (two) times daily.    Dispense:  14 capsule    Refill:  0   Tommi Rumps, MD [place]

## 2016-10-09 NOTE — Assessment & Plan Note (Signed)
She is having some symptoms. Discussed the appropriate time to take her Nexium. She'll start taking this 30 minutes prior to breakfast. She'll contact us in 2 weeks if continuing to have symptoms with this.

## 2016-10-09 NOTE — Assessment & Plan Note (Signed)
UA and symptoms most consistent with UTI. We'll treat with Keflex. Send urine for culture and microscopy.

## 2016-10-09 NOTE — Patient Instructions (Signed)
Nice to see you. We are going to treat you for a UTI with Keflex. You will take this twice daily for a week. We'll contact you when you're urine culture returns. Please change when you take your esomeprazole to 30 minutes prior to eating breakfast. You will take this once daily. If you continue to have reflux symptoms despite this change please let us know in 2 weeks.

## 2016-10-09 NOTE — Assessment & Plan Note (Signed)
Continue MiraLAX. Monitor for recurrence.

## 2016-10-11 LAB — URINE CULTURE: ORGANISM ID, BACTERIA: NO GROWTH

## 2016-10-12 ENCOUNTER — Other Ambulatory Visit: Payer: Self-pay | Admitting: Family Medicine

## 2016-10-12 ENCOUNTER — Telehealth: Payer: Self-pay | Admitting: Radiology

## 2016-10-12 ENCOUNTER — Telehealth: Payer: Self-pay

## 2016-10-12 DIAGNOSIS — R300 Dysuria: Secondary | ICD-10-CM

## 2016-10-12 NOTE — Telephone Encounter (Signed)
Pt coming in for labs tomorrow, please place future orders. Thank you.  

## 2016-10-12 NOTE — Telephone Encounter (Signed)
Left message to return call 

## 2016-10-12 NOTE — Telephone Encounter (Signed)
Patient called and states she is still having burning with urination and would like to know what she should do since her urine culture was negative

## 2016-10-12 NOTE — Telephone Encounter (Signed)
Patient states she does not see any irritation externally, patient is scheduled for lab tomorrow please place order

## 2016-10-12 NOTE — Telephone Encounter (Signed)
We could have her urine rechecked in the lab. It may be that the burning is coming from external irritation and we could also see her back in the office to complete a pelvic exam to look for causes. Thanks.

## 2016-10-13 ENCOUNTER — Other Ambulatory Visit (INDEPENDENT_AMBULATORY_CARE_PROVIDER_SITE_OTHER): Payer: Medicare Other

## 2016-10-13 DIAGNOSIS — R300 Dysuria: Secondary | ICD-10-CM | POA: Diagnosis not present

## 2016-10-13 LAB — POCT URINALYSIS DIPSTICK
Bilirubin, UA: NEGATIVE
GLUCOSE UA: NEGATIVE
Ketones, UA: NEGATIVE
Nitrite, UA: NEGATIVE
PH UA: 6 (ref 5.0–8.0)
Protein, UA: NEGATIVE
Spec Grav, UA: 1.01 (ref 1.010–1.025)
UROBILINOGEN UA: 0.2 U/dL

## 2016-10-13 NOTE — Addendum Note (Signed)
Addended by: Leeanne Rio on: 10/13/2016 03:42 PM   Modules accepted: Orders

## 2016-10-14 LAB — URINALYSIS, MICROSCOPIC ONLY
CRYSTALS: NONE SEEN [HPF]
Casts: NONE SEEN [LPF]
Yeast: NONE SEEN [HPF]

## 2016-10-14 LAB — URINE CULTURE: Organism ID, Bacteria: NO GROWTH

## 2016-10-17 ENCOUNTER — Other Ambulatory Visit: Payer: Self-pay

## 2016-10-17 MED ORDER — ESOMEPRAZOLE MAGNESIUM 40 MG PO CPDR: 40.0000 mg | DELAYED_RELEASE_CAPSULE | Freq: Every day | ORAL | 3 refills | Status: DC

## 2016-10-17 NOTE — Telephone Encounter (Signed)
-----   Message from Leone Haven, MD sent at 10/16/2016  5:34 PM EDT ----- Please let the patient know that her urine culture did not reveal an infection. If she continues to have symptoms we need to refer her to urology. Thanks.

## 2016-10-17 NOTE — Telephone Encounter (Signed)
Patient notified of lab results and states since starting the premarin again she has not had any symptoms. Patient would like refill on Nexium.

## 2016-11-27 DIAGNOSIS — H903 Sensorineural hearing loss, bilateral: Secondary | ICD-10-CM | POA: Diagnosis not present

## 2016-11-27 DIAGNOSIS — H6123 Impacted cerumen, bilateral: Secondary | ICD-10-CM | POA: Diagnosis not present

## 2017-01-01 DIAGNOSIS — Z23 Encounter for immunization: Secondary | ICD-10-CM | POA: Diagnosis not present

## 2017-02-06 DIAGNOSIS — L309 Dermatitis, unspecified: Secondary | ICD-10-CM | POA: Diagnosis not present

## 2017-02-24 DIAGNOSIS — J00 Acute nasopharyngitis [common cold]: Secondary | ICD-10-CM | POA: Diagnosis not present

## 2017-04-11 ENCOUNTER — Ambulatory Visit (INDEPENDENT_AMBULATORY_CARE_PROVIDER_SITE_OTHER): Payer: Medicare Other

## 2017-04-11 ENCOUNTER — Ambulatory Visit (INDEPENDENT_AMBULATORY_CARE_PROVIDER_SITE_OTHER): Payer: Medicare Other | Admitting: Family Medicine

## 2017-04-11 ENCOUNTER — Telehealth: Payer: Self-pay | Admitting: Family Medicine

## 2017-04-11 ENCOUNTER — Encounter: Payer: Self-pay | Admitting: Family Medicine

## 2017-04-11 VITALS — BP 140/60 | HR 94 | Temp 97.8°F | Ht 62.0 in | Wt 121.6 lb

## 2017-04-11 DIAGNOSIS — K219 Gastro-esophageal reflux disease without esophagitis: Secondary | ICD-10-CM

## 2017-04-11 DIAGNOSIS — C44311 Basal cell carcinoma of skin of nose: Secondary | ICD-10-CM | POA: Diagnosis not present

## 2017-04-11 DIAGNOSIS — Z8639 Personal history of other endocrine, nutritional and metabolic disease: Secondary | ICD-10-CM

## 2017-04-11 DIAGNOSIS — E785 Hyperlipidemia, unspecified: Secondary | ICD-10-CM | POA: Diagnosis not present

## 2017-04-11 DIAGNOSIS — M25552 Pain in left hip: Secondary | ICD-10-CM

## 2017-04-11 DIAGNOSIS — Z85828 Personal history of other malignant neoplasm of skin: Secondary | ICD-10-CM

## 2017-04-11 NOTE — Telephone Encounter (Signed)
Copied from [place]. Topic: Quick Communication - See Telephone Encounter >> Apr 11, 2017  3:32 PM Bea Graff, NT wrote: CRM for notification. See Telephone encounter for: Pt was seen today and states she gave the wrong info of who took the cyst off her nose. The doctors name is Dr. Sarajane Jews at [facility].   04/11/17.

## 2017-04-11 NOTE — Assessment & Plan Note (Signed)
Discomfort is in an odd location.  We will check an x-ray and potentially refer to sports medicine or orthopedics.

## 2017-04-11 NOTE — Telephone Encounter (Signed)
FYI

## 2017-04-11 NOTE — Assessment & Plan Note (Signed)
No signs of recurrence on exam of the nose and face.  We will get her to follow-up with dermatology.

## 2017-04-11 NOTE — Progress Notes (Signed)
Carol Rumps, MD Phone: [phone]  Carol Stark is a 84 y.o. female who presents today for follow-up.  GERD: Notes symptoms are relatively well controlled.  She takes Tums prophylactically before dinner.  Takes her Nexium before she goes to bed.  No abdominal pain.  No reflux symptoms.  No blood in her stool.  Hyperlipidemia: No chest pain.  She is trying to watch her diet.  Not really exercising.  She was previously on medication some time ago.  She reports her left hip has been bothering her.  Typically bothers her most when she walks.  Also bothers her when she moves around in bed.  This has been going on for 2 weeks.  No specific movements bother it.  No falls.  No injuries.  Basal cell carcinoma: Previously followed by dermatology.  Has not seen them since it was removed from her left nasal ala. Has not recurred.   Social History   Tobacco Use  Smoking Status Never Smoker  Smokeless Tobacco Never Used     ROS see history of present illness  Objective  Physical Exam Vitals:   04/11/17 1322  BP: 140/60  Pulse: 94  Temp: 97.8 F (36.6 C)  SpO2: 97%    BP Readings from Last 3 Encounters:  04/11/17 140/60  10/09/16 110/68  07/14/16 (!) 142/73   Wt Readings from Last 3 Encounters:  04/11/17 121 lb 9.6 oz (55.2 kg)  10/09/16 120 lb (54.4 kg)  06/29/16 118 lb 6.4 oz (53.7 kg)    Physical Exam  Constitutional: No distress.  Cardiovascular: Normal rate, regular rhythm and normal heart sounds.  Pulmonary/Chest: Effort normal and breath sounds normal.  Musculoskeletal: She exhibits no edema.  Patient with discomfort on internal range of motion of left hip, no discomfort on external range of motion, there is tenderness over the left anterior ilium, no overlying skin changes, induration, or fluctuance, no tenderness over the greater trochanter  Neurological: She is alert. Gait normal.  Skin: Skin is warm and dry. She is not diaphoretic.      Assessment/Plan: Please see individual problem list.  Basal cell carcinoma No signs of recurrence on exam of the nose and face.  We will get her to follow-up with dermatology.  GERD (gastroesophageal reflux disease) Relatively well controlled though she is not taking the Nexium at the appropriate time.  She will start taking this 30 minutes prior to dinner.  If she develops any issues she will let us know.  Left hip pain Discomfort is in an odd location.  We will check an x-ray and potentially refer to sports medicine or orthopedics.  History of hyperlipidemia History of hyperlipidemia.  No longer medication.  Will check lab work.   Carol Stark was seen today for follow-up.  Diagnoses and all orders for this visit:  Left hip pain -     DG HIP UNILAT WITH PELVIS 2-3 VIEWS LEFT; Future  Hyperlipidemia, unspecified hyperlipidemia type -     Comp Met (CMET); Future -     Lipid panel; Future  Basal cell carcinoma (BCC) of skin of nose  Gastroesophageal reflux disease, esophagitis presence not specified  History of hyperlipidemia  History of basal cell carcinoma -     Ambulatory referral to Dermatology    Orders Placed This Encounter  Procedures  . DG HIP UNILAT WITH PELVIS 2-3 VIEWS LEFT    Standing Status:   Future    Number of Occurrences:   1    Standing Expiration Date:  06/10/2018    Order Specific Question:   Reason for Exam (SYMPTOM  OR DIAGNOSIS REQUIRED)    Answer:   left hip and ilium pain    Order Specific Question:   Preferred imaging location?    Answer:   [facility] Specific Question:   Radiology Contrast Protocol - do NOT remove file path    Answer:   [email or web address]  . Comp Met (CMET)    Standing Status:   Future    Standing Expiration Date:   04/11/2018  . Lipid panel    Standing Status:   Future    Standing Expiration Date:   04/11/2018  . Ambulatory referral to Dermatology     Referral Priority:   Routine    Referral Type:   Consultation    Referral Reason:   Specialty Services Required    Requested Specialty:   Dermatology    Number of Visits Requested:   1    No orders of the defined types were placed in this encounter.    Carol Rumps, MD [place]

## 2017-04-11 NOTE — Assessment & Plan Note (Signed)
History of hyperlipidemia.  No longer medication.  Will check lab work.

## 2017-04-11 NOTE — Addendum Note (Signed)
Addended by: Leone Haven on: 04/11/2017 06:45 PM   Modules accepted: Orders

## 2017-04-11 NOTE — Telephone Encounter (Signed)
Noted/  Referral changed

## 2017-04-11 NOTE — Progress Notes (Signed)
Pre visit review using our clinic review tool, if applicable. No additional management support is needed unless otherwise documented below in the visit note. 

## 2017-04-11 NOTE — Assessment & Plan Note (Signed)
Relatively well controlled though she is not taking the Nexium at the appropriate time.  She will start taking this 30 minutes prior to dinner.  If she develops any issues she will let us know.

## 2017-04-11 NOTE — Patient Instructions (Addendum)
Nice to see you. We will get an x-ray today and contact you with the results. Please try taking the Nexium 30 minutes before you eat dinner. We will get you back in to see dermatology for follow-up skin exam. We will have you return for a fasting cholesterol panel.

## 2017-04-12 ENCOUNTER — Other Ambulatory Visit: Payer: Self-pay | Admitting: Family Medicine

## 2017-04-12 DIAGNOSIS — M25552 Pain in left hip: Secondary | ICD-10-CM

## 2017-04-13 ENCOUNTER — Encounter: Payer: Self-pay | Admitting: Family Medicine

## 2017-04-23 DIAGNOSIS — M1612 Unilateral primary osteoarthritis, left hip: Secondary | ICD-10-CM | POA: Diagnosis not present

## 2017-05-16 DIAGNOSIS — M160 Bilateral primary osteoarthritis of hip: Secondary | ICD-10-CM | POA: Diagnosis not present

## 2017-05-18 ENCOUNTER — Other Ambulatory Visit: Payer: Medicare Other

## 2017-05-24 DIAGNOSIS — H2513 Age-related nuclear cataract, bilateral: Secondary | ICD-10-CM | POA: Diagnosis not present

## 2017-06-14 DIAGNOSIS — M1612 Unilateral primary osteoarthritis, left hip: Secondary | ICD-10-CM | POA: Diagnosis not present

## 2017-06-15 DIAGNOSIS — H2511 Age-related nuclear cataract, right eye: Secondary | ICD-10-CM | POA: Diagnosis not present

## 2017-06-17 DIAGNOSIS — M1612 Unilateral primary osteoarthritis, left hip: Secondary | ICD-10-CM | POA: Insufficient documentation

## 2017-06-18 ENCOUNTER — Encounter: Payer: Self-pay | Admitting: *Deleted

## 2017-06-21 ENCOUNTER — Ambulatory Visit: Payer: Medicare Other | Admitting: Anesthesiology

## 2017-06-21 ENCOUNTER — Ambulatory Visit
Admission: RE | Admit: 2017-06-21 | Discharge: 2017-06-21 | Disposition: A | Discharge status: Home / Self Care | Payer: Medicare Other | Source: Ambulatory Visit | Attending: Ophthalmology | Admitting: Ophthalmology

## 2017-06-21 ENCOUNTER — Encounter
Admission: RE | Disposition: A | Discharge status: Home / Self Care | Payer: Self-pay | Source: Ambulatory Visit | Attending: Ophthalmology

## 2017-06-21 ENCOUNTER — Encounter: Payer: Self-pay | Admitting: Emergency Medicine

## 2017-06-21 DIAGNOSIS — H2511 Age-related nuclear cataract, right eye: Secondary | ICD-10-CM | POA: Insufficient documentation

## 2017-06-21 DIAGNOSIS — K219 Gastro-esophageal reflux disease without esophagitis: Secondary | ICD-10-CM | POA: Insufficient documentation

## 2017-06-21 DIAGNOSIS — M1990 Unspecified osteoarthritis, unspecified site: Secondary | ICD-10-CM | POA: Insufficient documentation

## 2017-06-21 DIAGNOSIS — Z888 Allergy status to other drugs, medicaments and biological substances status: Secondary | ICD-10-CM | POA: Diagnosis not present

## 2017-06-21 HISTORY — PX: CATARACT EXTRACTION W/PHACO: SHX586

## 2017-06-21 HISTORY — DX: Gastro-esophageal reflux disease without esophagitis: K21.9

## 2017-06-21 SURGERY — PHACOEMULSIFICATION, CATARACT, WITH IOL INSERTION
Anesthesia: Monitor Anesthesia Care | Site: Eye | Laterality: Right | Wound class: Clean

## 2017-06-21 MED ORDER — CARBACHOL 0.01 % IO SOLN
INTRAOCULAR | Status: DC | PRN
  Administered 2017-06-21: 0.5 mL via INTRAOCULAR

## 2017-06-21 MED ORDER — SODIUM CHLORIDE 0.9 % IV SOLN
INTRAVENOUS | Status: DC
  Administered 2017-06-21: 09:00:00 via INTRAVENOUS

## 2017-06-21 MED ORDER — EPINEPHRINE PF 1 MG/ML IJ SOLN
INTRAMUSCULAR | Status: AC
  Filled 2017-06-21: qty 2

## 2017-06-21 MED ORDER — POVIDONE-IODINE 5 % OP SOLN
OPHTHALMIC | Status: AC
  Filled 2017-06-21: qty 30

## 2017-06-21 MED ORDER — LIDOCAINE HCL (PF) 4 % IJ SOLN
INTRAMUSCULAR | Status: AC
  Filled 2017-06-21: qty 5

## 2017-06-21 MED ORDER — MOXIFLOXACIN HCL 0.5 % OP SOLN
OPHTHALMIC | Status: AC
  Administered 2017-06-21: 1 [drp] via OPHTHALMIC
  Filled 2017-06-21: qty 6

## 2017-06-21 MED ORDER — POVIDONE-IODINE 5 % OP SOLN
OPHTHALMIC | Status: DC | PRN
  Administered 2017-06-21: 1 via OPHTHALMIC

## 2017-06-21 MED ORDER — FENTANYL CITRATE (PF) 100 MCG/2ML IJ SOLN
INTRAMUSCULAR | Status: DC | PRN
  Administered 2017-06-21: 50 ug via INTRAVENOUS
  Administered 2017-06-21 (×2): 25 ug via INTRAVENOUS

## 2017-06-21 MED ORDER — FENTANYL CITRATE (PF) 100 MCG/2ML IJ SOLN
INTRAMUSCULAR | Status: AC
  Filled 2017-06-21: qty 2

## 2017-06-21 MED ORDER — NEOMYCIN-POLYMYXIN-DEXAMETH 0.1 % OP OINT
TOPICAL_OINTMENT | OPHTHALMIC | Status: DC | PRN
  Administered 2017-06-21: 1 via OPHTHALMIC

## 2017-06-21 MED ORDER — MOXIFLOXACIN HCL 0.5 % OP SOLN
OPHTHALMIC | Status: DC | PRN
  Administered 2017-06-21: 0.2 mL via OPHTHALMIC

## 2017-06-21 MED ORDER — MOXIFLOXACIN HCL 0.5 % OP SOLN
1.0000 [drp] | OPHTHALMIC | Status: DC | PRN
  Administered 2017-06-21 (×3): 1 [drp] via OPHTHALMIC

## 2017-06-21 MED ORDER — ARMC OPHTHALMIC DILATING DROPS
1.0000 "application " | OPHTHALMIC | Status: AC
  Administered 2017-06-21 (×3): 1 via OPHTHALMIC

## 2017-06-21 MED ORDER — NA HYALUR & NA CHOND-NA HYALUR 0.55-0.5 ML IO KIT
PACK | INTRAOCULAR | Status: AC
  Filled 2017-06-21: qty 1.05

## 2017-06-21 MED ORDER — BSS IO SOLN
INTRAOCULAR | Status: DC | PRN
  Administered 2017-06-21: 10:00:00 via OPHTHALMIC

## 2017-06-21 MED ORDER — NA HYALUR & NA CHOND-NA HYALUR 0.4-0.35 ML IO KIT
PACK | INTRAOCULAR | Status: DC | PRN
  Administered 2017-06-21: .35 mL via INTRAOCULAR

## 2017-06-21 MED ORDER — ARMC OPHTHALMIC DILATING DROPS
OPHTHALMIC | Status: AC
  Administered 2017-06-21: 1 via OPHTHALMIC
  Filled 2017-06-21: qty 0.4

## 2017-06-21 MED ORDER — LIDOCAINE HCL (PF) 4 % IJ SOLN
INTRAMUSCULAR | Status: DC | PRN
  Administered 2017-06-21: 4 mL via OPHTHALMIC

## 2017-06-21 MED ORDER — NEOMYCIN-POLYMYXIN-DEXAMETH 3.5-10000-0.1 OP OINT
TOPICAL_OINTMENT | OPHTHALMIC | Status: AC
  Filled 2017-06-21: qty 3.5

## 2017-06-21 SURGICAL SUPPLY — 16 items
GLOVE BIO SURGEON STRL SZ8 (GLOVE) ×3 IMPLANT
GLOVE BIOGEL M 6.5 STRL (GLOVE) ×3 IMPLANT
GLOVE SURG LX 7.5 STRW (GLOVE) ×2
GLOVE SURG LX STRL 7.5 STRW (GLOVE) ×1 IMPLANT
GOWN STRL REUS W/ TWL LRG LVL3 (GOWN DISPOSABLE) ×2 IMPLANT
GOWN STRL REUS W/TWL LRG LVL3 (GOWN DISPOSABLE) ×4
LABEL CATARACT MEDS ST (LABEL) ×3 IMPLANT
LENS IOL TECNIS ITEC 22.5 (Intraocular Lens) ×3 IMPLANT
PACK CATARACT (MISCELLANEOUS) ×3 IMPLANT
PACK CATARACT BRASINGTON LX (MISCELLANEOUS) ×3 IMPLANT
PACK EYE AFTER SURG (MISCELLANEOUS) ×3 IMPLANT
SOL BSS BAG (MISCELLANEOUS) ×3
SOLUTION BSS BAG (MISCELLANEOUS) ×1 IMPLANT
SYR 5ML LL (SYRINGE) ×3 IMPLANT
WATER STERILE IRR 250ML POUR (IV SOLUTION) ×3 IMPLANT
WIPE NON LINTING 3.25X3.25 (MISCELLANEOUS) ×3 IMPLANT

## 2017-06-21 NOTE — H&P (Signed)
The History and Physical notes are on paper, have been signed, and are to be scanned. The patient remains stable and unchanged from the H&P.   Previous H&P reviewed, patient examined, and there are no changes.  Carol Stark 06/21/2017 8:42 AM

## 2017-06-21 NOTE — Anesthesia Post-op Follow-up Note (Signed)
Anesthesia QCDR form completed.        

## 2017-06-21 NOTE — Anesthesia Preprocedure Evaluation (Signed)
Anesthesia Evaluation  Patient identified by MRN, date of birth, ID band Patient awake    Reviewed: Allergy & Precautions, H&P , NPO status , Patient's Chart, lab work & pertinent test results  History of Anesthesia Complications Negative for: history of anesthetic complications  Airway Mallampati: III  TM Distance: <3 FB Neck ROM: limited    Dental  (+) Chipped, Poor Dentition, Missing, Partial Lower   Pulmonary neg pulmonary ROS, neg shortness of breath,           Cardiovascular Exercise Tolerance: Good negative cardio ROS       Neuro/Psych negative neurological ROS  negative psych ROS   GI/Hepatic Neg liver ROS, GERD  Medicated and Controlled,  Endo/Other  negative endocrine ROS  Renal/GU      Musculoskeletal  (+) Arthritis ,   Abdominal   Peds  Hematology negative hematology ROS (+)   Anesthesia Other Findings Past Medical History: No date: Allergic rhinitis No date: Arthritis No date: GERD (gastroesophageal reflux disease) No date: History of chicken pox No date: UTI (lower urinary tract infection)  Past Surgical History: No date: COLONOSCOPY No date: TONSILLECTOMY  BMI    Body Mass Index:  20.60 kg/m      Reproductive/Obstetrics negative OB ROS                             Anesthesia Physical  Anesthesia Plan  ASA: II  Anesthesia Plan: MAC   Post-op Pain Management:    Induction: Intravenous  PONV Risk Score and Plan:   Airway Management Planned: Natural Airway and Nasal Cannula  Additional Equipment:   Intra-op Plan:   Post-operative Plan:   Informed Consent: I have reviewed the patients History and Physical, chart, labs and discussed the procedure including the risks, benefits and alternatives for the proposed anesthesia with the patient or authorized representative who has indicated his/her understanding and acceptance.   Dental Advisory  Given  Plan Discussed with: Anesthesiologist, CRNA and Surgeon  Anesthesia Plan Comments: (Patient consented for risks of anesthesia including but not limited to:  - adverse reactions to medications - damage to teeth, lips or other oral mucosa - sore throat or hoarseness - Damage to heart, brain, lungs or loss of life  Patient voiced understanding.)        Anesthesia Quick Evaluation  

## 2017-06-21 NOTE — Discharge Instructions (Signed)
Eye Surgery Discharge Instructions  Expect mild scratchy sensation or mild soreness. DO NOT RUB YOUR EYE!  The day of surgery:  Minimal physical activity, but bed rest is not required  No reading, computer work, or close hand work  No bending, lifting, or straining.  May watch TV  For 24 hours:  No driving, legal decisions, or alcoholic beverages  Safety precautions  Eat anything you prefer: It is better to start with liquids, then soup then solid foods.  _____ Eye patch should be worn until postoperative exam tomorrow.  ____ Solar shield eyeglasses should be worn for comfort in the sunlight/patch while sleeping  Resume all regular medications including aspirin or Coumadin if these were discontinued prior to surgery. You may shower, bathe, shave, or wash your hair. Tylenol may be taken for mild discomfort.  Call your doctor if you experience significant pain, nausea, or vomiting, fever > 101 or other signs of infection. [phone] or [phone] Specific instructions:  Follow-up Information    Leandrew Koyanagi, MD Follow up on 06/22/2017.   Specialty:  Ophthalmology Why:  9:40 Contact information: [address]   [place] [address] [phone]

## 2017-06-21 NOTE — Op Note (Signed)
OPERATIVE NOTE  Carol Stark [id number] 06/21/2017   PREOPERATIVE DIAGNOSIS:  Nuclear Sclerotic Cataract Right Eye H25.11   POSTOPERATIVE DIAGNOSIS: Nuclear Sclerotic Cataract Right Eye H25.11          PROCEDURE:  Phacoemusification with posterior chamber intraocular lens placement of the right eye   LENS:   Implant Name Type Inv. Item Serial No. Manufacturer Lot No. LRB No. Used  LENS IOL DIOP 22.5 - [id number] Intraocular Lens LENS IOL DIOP 22.5 [id number] AMO  Right 1       ULTRASOUND TIME: 14 %  of 0 minutes 55 seconds, CDE 7.9  SURGEON:  Wyonia Hough, MD   ANESTHESIA:  Topical with tetracaine drops and 2% Xylocaine jelly, augmented with 1% preservative-free intracameral lidocaine.    COMPLICATIONS:  None.   DESCRIPTION OF PROCEDURE:  The patient was identified in the holding room and transported to the operating room and placed in the supine position under the operating microscope. Theright eye was identified as the operative eye and it was prepped and draped in the usual sterile ophthalmic fashion.   A 1 millimeter clear-corneal paracentesis was made at the 12:00 position.  0.5 ml of preservative-free 1% lidocaine was injected into the anterior chamber. The anterior chamber was filled with Viscoat viscoelastic.  A 2.4 millimeter keratome was used to make a near-clear corneal incision at the 9:00 position. A curvilinear capsulorrhexis was made with a cystotome and capsulorrhexis forceps.  Balanced salt solution was used to hydrodissect and hydrodelineate the nucleus.   Phacoemulsification was then used in stop and chop fashion to remove the lens nucleus and epinucleus.  The remaining cortex was then removed using the irrigation and aspiration handpiece. Provisc was then placed into the capsular bag to distend it for lens placement.  A lens was then injected into the capsular bag.  The remaining viscoelastic was aspirated.  Wounds were hydrated with  balanced salt solution.  The anterior chamber was inflated to a physiologic pressure with balanced salt solution. Vigamox 0.2 ml of a 1mg  per ml solution was injected into the anterior chamber for a dose of 0.2 mg of intracameral antibiotic at the completion of the case. Miostat was placed into the anterior chamber to constrict the pupil.  No wound leaks were noted.  Topical Vigamox drops and Maxitrol ointment were applied to the eye.  The patient was taken to the recovery room in stable condition without complications of anesthesia or surgery.  Carol Stark 06/21/2017, 10:15 AM

## 2017-06-21 NOTE — OR Nursing (Signed)
10:40.  Nausea much better.  Sent peppermint oil on cotton ball in ziplock home to use in the car.

## 2017-06-21 NOTE — Anesthesia Postprocedure Evaluation (Signed)
Anesthesia Post Note  Patient: Carol Stark  Procedure(s) Performed: CATARACT EXTRACTION PHACO AND INTRAOCULAR LENS PLACEMENT (IOC) (Right Eye)  Patient location during evaluation: PACU Anesthesia Type: MAC Level of consciousness: awake Pain management: pain level controlled Vital Signs Assessment: post-procedure vital signs reviewed and stable Respiratory status: spontaneous breathing Cardiovascular status: blood pressure returned to baseline Postop Assessment: no apparent nausea or vomiting Anesthetic complications: no     Last Vitals:  Vitals:   06/21/17 1016  BP: (!) 151/89  Pulse: 86  Temp: 36.7 C  SpO2: 98%    Last Pain: There were no vitals filed for this visit.               Hedda Slade

## 2017-06-21 NOTE — Transfer of Care (Signed)
Immediate Anesthesia Transfer of Care Note  Patient: Carol Stark  Procedure(s) Performed: CATARACT EXTRACTION PHACO AND INTRAOCULAR LENS PLACEMENT (IOC) (Right Eye)  Patient Location: PACU  Anesthesia Type:MAC  Level of Consciousness: awake, alert  and oriented  Airway & Oxygen Therapy: Patient Spontanous Breathing  Post-op Assessment: Report given to RN and Post -op Vital signs reviewed and stable  Post vital signs: Reviewed and stable  Last Vitals:  Vitals:   06/21/17 1016  BP: (!) 151/89  Pulse: 86  Temp: 36.7 C  SpO2: 98%    Last Pain: There were no vitals filed for this visit.       Complications: No apparent anesthesia complications

## 2017-06-22 ENCOUNTER — Encounter: Payer: Self-pay | Admitting: Ophthalmology

## 2017-07-03 ENCOUNTER — Ambulatory Visit: Payer: Medicare Other | Admitting: Family Medicine

## 2017-07-10 ENCOUNTER — Ambulatory Visit (INDEPENDENT_AMBULATORY_CARE_PROVIDER_SITE_OTHER): Payer: Medicare Other | Admitting: Family Medicine

## 2017-07-10 ENCOUNTER — Encounter: Payer: Self-pay | Admitting: Family Medicine

## 2017-07-10 ENCOUNTER — Other Ambulatory Visit: Payer: Self-pay

## 2017-07-10 VITALS — BP 112/62 | HR 87 | Temp 97.4°F | Ht 64.0 in | Wt 120.0 lb

## 2017-07-10 DIAGNOSIS — R143 Flatulence: Secondary | ICD-10-CM | POA: Insufficient documentation

## 2017-07-10 DIAGNOSIS — N889 Noninflammatory disorder of cervix uteri, unspecified: Secondary | ICD-10-CM

## 2017-07-10 DIAGNOSIS — H269 Unspecified cataract: Secondary | ICD-10-CM | POA: Diagnosis not present

## 2017-07-10 DIAGNOSIS — K219 Gastro-esophageal reflux disease without esophagitis: Secondary | ICD-10-CM | POA: Diagnosis not present

## 2017-07-10 DIAGNOSIS — N952 Postmenopausal atrophic vaginitis: Secondary | ICD-10-CM

## 2017-07-10 DIAGNOSIS — Z85828 Personal history of other malignant neoplasm of skin: Secondary | ICD-10-CM

## 2017-07-10 DIAGNOSIS — R300 Dysuria: Secondary | ICD-10-CM

## 2017-07-10 DIAGNOSIS — C44311 Basal cell carcinoma of skin of nose: Secondary | ICD-10-CM | POA: Diagnosis not present

## 2017-07-10 LAB — POCT URINALYSIS DIPSTICK
Bilirubin, UA: NEGATIVE
Glucose, UA: NEGATIVE
Ketones, UA: NEGATIVE
NITRITE UA: NEGATIVE
PH UA: 5.5 (ref 5.0–8.0)
PROTEIN UA: POSITIVE
RBC UA: POSITIVE
UROBILINOGEN UA: 0.2 U/dL

## 2017-07-10 LAB — URINALYSIS, MICROSCOPIC ONLY

## 2017-07-10 NOTE — Patient Instructions (Signed)
Nice to see you. We will get you to see dermatology and gynecology. Please look at the foods that I gave you and see if you eat many of those as they could potentially be causing her increased gas.

## 2017-07-10 NOTE — Assessment & Plan Note (Signed)
She will continue to see ophthalmology. 

## 2017-07-10 NOTE — Assessment & Plan Note (Signed)
Refer to dermatology locally for follow-up.

## 2017-07-10 NOTE — Assessment & Plan Note (Signed)
Refer to gynecology for evaluation.

## 2017-07-10 NOTE — Assessment & Plan Note (Signed)
Slightly increased.  Given possible foods that could lead to this.  She will monitor.

## 2017-07-10 NOTE — Assessment & Plan Note (Signed)
Inadequately controlled though she is only using the Premarin once weekly.  She will start using this twice weekly.  Will refer to gynecology.

## 2017-07-10 NOTE — Assessment & Plan Note (Signed)
Controlled.  Continue Nexium.

## 2017-07-10 NOTE — Progress Notes (Signed)
Tommi Rumps, MD Phone: [phone]  Carol Stark is a 84 y.o. female who presents today for f/u.  GERD:   Reflux symptoms: no   Abd pain: no   Blood in stool: no  Dysphagia: no    Medication: taking nexium  Postmenopausal vaginal atrophy: She notes some dryness that has been chronic.  She has only been using the Premarin about once a week.  She notes having the sensation of needing to urinate more when she does use the Premarin.  Rare dysuria.  No vaginal bleeding, pain, itching, or discharge.  She did not follow-up with dermatology regarding follow-up for basal cell carcinoma.  She wants to be referred locally.  She reports increased passing of gas.  No diarrhea.  No changes in foods or medications.  She saw ophthalmology for cataracts and had her right cataract removed.  She is following up with them for left cataract removal.  She saw orthopedics for her hip pain. She is to conservatively manage this.   Social History   Tobacco Use  Smoking Status Never Smoker  Smokeless Tobacco Never Used     ROS see history of present illness  Objective  Physical Exam Vitals:   07/10/17 0846  BP: 112/62  Pulse: 87  Temp: (!) 97.4 F (36.3 C)  SpO2: 98%    BP Readings from Last 3 Encounters:  07/10/17 112/62  06/21/17 (!) (P) 170/75  04/11/17 140/60   Wt Readings from Last 3 Encounters:  07/10/17 120 lb (54.4 kg)  06/18/17 120 lb (54.4 kg)  04/11/17 121 lb 9.6 oz (55.2 kg)    Physical Exam  Constitutional: No distress.  Cardiovascular: Normal rate, regular rhythm and normal heart sounds.  Pulmonary/Chest: Effort normal and breath sounds normal.  Abdominal: Soft. Bowel sounds are normal. She exhibits no distension. There is no tenderness. There is no rebound and no guarding.  Genitourinary:  Genitourinary Comments: Normal labia, normal-appearing vaginal mucosa with Premarin cream noted, cervix with small pink area at the cervical loss with no obvious  mass, difficult to get this into view  Musculoskeletal: She exhibits no edema.  Neurological: She is alert. Gait normal.  Skin: Skin is warm and dry. She is not diaphoretic.     Assessment/Plan: Please see individual problem list.  GERD (gastroesophageal reflux disease) Controlled.  Continue Nexium.  Postmenopausal atrophic vaginitis Inadequately controlled though she is only using the Premarin once weekly.  She will start using this twice weekly.  Will refer to gynecology.  Abnormal appearance of cervix Refer to gynecology for evaluation.  Basal cell carcinoma Refer to dermatology locally for follow-up.  Cataracts, bilateral She will continue to see ophthalmology.  Passing gas Slightly increased.  Given possible foods that could lead to this.  She will monitor.   Orders Placed This Encounter  Procedures  . Urine Culture  . Urine Microscopic Only  . Ambulatory referral to Dermatology    Referral Priority:   Routine    Referral Type:   Consultation    Referral Reason:   Specialty Services Required    Requested Specialty:   Dermatology    Number of Visits Requested:   1  . Ambulatory referral to Gynecology    Referral Priority:   Routine    Referral Type:   Consultation    Referral Reason:   Specialty Services Required    Requested Specialty:   Gynecology    Number of Visits Requested:   1  . POCT Urinalysis Dipstick  No orders of the defined types were placed in this encounter.    Tommi Rumps, MD [place]

## 2017-07-11 LAB — URINE CULTURE
MICRO NUMBER: 90406447
Result:: NO GROWTH
SPECIMEN QUALITY:: ADEQUATE

## 2017-07-13 ENCOUNTER — Telehealth: Payer: Self-pay | Admitting: Family Medicine

## 2017-07-13 ENCOUNTER — Telehealth: Payer: Self-pay | Admitting: Obstetrics & Gynecology

## 2017-07-13 NOTE — Telephone Encounter (Signed)
I looked in patient's chart and past notes and phone encounters and I do not see anything in reference to her returning in two weeks for labs. Please advise

## 2017-07-13 NOTE — Telephone Encounter (Signed)
Patient needs fasting labs done, orders placed from January and will call back on Monday to schedule

## 2017-07-13 NOTE — Telephone Encounter (Signed)
[facility] referring for Abnormal appearance of cervix. Called and left voicemail for patient to call back to be schedule

## 2017-07-13 NOTE — Telephone Encounter (Signed)
Copied from [place] [id number]. Topic: Quick Communication - See Telephone Encounter >> Jul 13, 2017  3:41 PM Vernona Rieger wrote: CRM for notification. See Telephone encounter for: 07/13/17. Patient called in about her lab appt. She was seen on 4/2 and said that she was suppose to sch a lab appt 1-2 weeks. No orders in. She said she will call back Monday to schedule.

## 2017-07-17 ENCOUNTER — Telehealth: Payer: Self-pay | Admitting: Family Medicine

## 2017-07-17 NOTE — Telephone Encounter (Signed)
Please find out if the estradiol that they want [place] to switch her to is a pill or a patch or is it a vaginal cream like the Premarin.  Please also confirm her Premarin dose.  Thanks.

## 2017-07-17 NOTE — Telephone Encounter (Signed)
Please advise 

## 2017-07-17 NOTE — Telephone Encounter (Signed)
Copied from [place] [id number]. Topic: Quick Communication - Rx Refill/Question >> Jul 17, 2017  1:53 PM Scherrie Gerlach wrote: Medication: PREMARIN vaginal cream Pt states her insurance will not pay for this (its $150) They are suggesting pt switch to ESTRADIOL Pt wants to know if that is ok with Dr Josephina Gip to switch?  [id number] [address], [place] [phone] (Phone) [phone] (Fax)  Ok to leave message

## 2017-07-18 NOTE — Telephone Encounter (Signed)
Left message to return call, ok for pec to speak to patient and get more information per DR.Sonnenbergs message.  I have called pharmacy and they were not aware about this, patient will need to check with insurance to see what is covered and cheaper

## 2017-07-19 ENCOUNTER — Telehealth: Payer: Self-pay | Admitting: Family Medicine

## 2017-07-19 NOTE — Telephone Encounter (Signed)
See other message

## 2017-07-19 NOTE — Telephone Encounter (Signed)
FYI   FYI.Marland KitchenMarland KitchenResponding to Dr. Ellen Henri message.   Routing comment     Elliot Cousin, RN 42 minutes ago (8:55 AM)     Re: message from Dr. Caryl Bis; Pt states the new medication is cream. She is unsure of the strength of the Premarin cream she is currently using. She is using one applicator 2 x's weekly.

## 2017-07-19 NOTE — Telephone Encounter (Signed)
Re: message from Dr. Caryl Bis; Pt states the new medication is cream. She is unsure of the strength of the Premarin cream she is currently using. She is using one applicator 2 x's weekly.

## 2017-07-20 ENCOUNTER — Other Ambulatory Visit (INDEPENDENT_AMBULATORY_CARE_PROVIDER_SITE_OTHER): Payer: Medicare Other

## 2017-07-20 ENCOUNTER — Other Ambulatory Visit: Payer: Medicare Other

## 2017-07-20 DIAGNOSIS — E785 Hyperlipidemia, unspecified: Secondary | ICD-10-CM

## 2017-07-20 LAB — COMPREHENSIVE METABOLIC PANEL
ALBUMIN: 4.2 g/dL (ref 3.5–5.2)
ALK PHOS: 72 U/L (ref 39–117)
ALT: 11 U/L (ref 0–35)
AST: 14 U/L (ref 0–37)
BUN: 20 mg/dL (ref 6–23)
CALCIUM: 9.3 mg/dL (ref 8.4–10.5)
CHLORIDE: 104 meq/L (ref 96–112)
CO2: 26 mEq/L (ref 19–32)
Creatinine, Ser: 0.74 mg/dL (ref 0.40–1.20)
GFR: 79.59 mL/min (ref >60.00)
Glucose, Bld: 115 mg/dL — ABNORMAL HIGH (ref 70–99)
POTASSIUM: 4.3 meq/L (ref 3.5–5.1)
Sodium: 139 mEq/L (ref 135–145)
Total Bilirubin: 0.5 mg/dL (ref 0.2–1.2)
Total Protein: 7.4 g/dL (ref 6.0–8.3)

## 2017-07-20 LAB — LIPID PANEL
CHOLESTEROL: 231 mg/dL — AB (ref 0–200)
HDL: 57.5 mg/dL (ref >39.00)
LDL CALC: 159 mg/dL — AB (ref 0–99)
NonHDL: 173.3
TRIGLYCERIDES: 72 mg/dL (ref 0.0–149.0)
Total CHOL/HDL Ratio: 4
VLDL: 14.4 mg/dL (ref 0.0–40.0)

## 2017-07-20 NOTE — Telephone Encounter (Signed)
Pt was given a coupon for premarin, she is going to try using this coupon first.

## 2017-07-20 NOTE — Telephone Encounter (Signed)
Noted. If the coupon does not help she should let us know.

## 2017-07-20 NOTE — Telephone Encounter (Signed)
Pt came in for labs. She did call her insurance and they gave her a prescription name of Estradiol cream. Please advise pt.

## 2017-07-23 ENCOUNTER — Ambulatory Visit (INDEPENDENT_AMBULATORY_CARE_PROVIDER_SITE_OTHER): Payer: Medicare Other | Admitting: Obstetrics and Gynecology

## 2017-07-23 ENCOUNTER — Encounter: Payer: Self-pay | Admitting: Obstetrics and Gynecology

## 2017-07-23 VITALS — BP 130/80 | HR 87 | Ht 64.0 in | Wt 119.0 lb

## 2017-07-23 DIAGNOSIS — N841 Polyp of cervix uteri: Secondary | ICD-10-CM | POA: Diagnosis not present

## 2017-07-23 DIAGNOSIS — N952 Postmenopausal atrophic vaginitis: Secondary | ICD-10-CM

## 2017-07-23 MED ORDER — ESTRADIOL 10 % CREA: 0.5000 g | TOPICAL_CREAM | 3 refills | Status: DC

## 2017-07-23 NOTE — Progress Notes (Signed)
Patient ID: Carol Stark, female   DOB: 06-23-1931, 84 y.o.   MRN: [id number]  Reason for Consult: Referral (abnormal appearance of cervix)   Referred by Leone Haven, MD  Subjective:     HPI:  Carol Stark is a 84 y.o. female she was referred here today for abnormal appearance of cervix. She has been feeling well. No vaginal bleeding or spotting. Denies a sensation of a vaginal buldge. Denies incontinence. Denies a history or abnormal pap smears. She had been using premarin cream, but it it no longer covered by her insurance. She would like to switch to Estradiol cream and requested a prescription for this. She denies weight changes, she denies feelings of pelvic fullness, she denies changes in bowel movements.   Past Medical History:  Diagnosis Date  . Allergic rhinitis   . Arthritis   . GERD (gastroesophageal reflux disease)   . History of chicken pox   . UTI (lower urinary tract infection)    Family History  Problem Relation Age of Onset  . Arthritis Mother   . Heart disease Father    Past Surgical History:  Procedure Laterality Date  . CATARACT EXTRACTION W/PHACO Right 06/21/2017   Procedure: CATARACT EXTRACTION PHACO AND INTRAOCULAR LENS PLACEMENT (IOC);  Surgeon: Leandrew Koyanagi, MD;  Location: [facility] ORS;  Service: Ophthalmology;  Laterality: Right;  [place] 00:55.2 AP% 14.4 CDE 7.93 Fluid Pack Lot # [id number]  . COLONOSCOPY    . TONSILLECTOMY      Short Social History:  Social History   Tobacco Use  . Smoking status: Never Smoker  . Smokeless tobacco: Never Used  Substance Use Topics  . Alcohol use: Yes    Alcohol/week: 0.0 oz    Allergies  Allergen Reactions  . Biaxin [Clarithromycin] Other (See Comments)    "Was not good"    Current Outpatient Medications  Medication Sig Dispense Refill  . B Complex Vitamins (B COMPLEX PO) Take 1 tablet by mouth daily.     . B Complex Vitamins (VITAMIN B COMPLEX 100 IJ) Take by mouth.    .  Calcium Carb-Cholecalciferol (CALCIUM 1000 + D PO) Take 1 tablet by mouth daily.     . calcium carbonate (TUMS - DOSED IN MG ELEMENTAL CALCIUM) 500 MG chewable tablet Chew 1 tablet by mouth 2 (two) times daily.     . Carboxymethylcell-Hypromellose (GENTEAL OP) Place 1 drop into both eyes 2 (two) times daily.    . Cholecalciferol (VITAMIN D-3 PO) Take 1 capsule by mouth daily.     Marland Kitchen esomeprazole (NEXIUM) 40 MG capsule Take 1 capsule (40 mg total) by mouth daily before breakfast. (Patient taking differently: Take 40 mg by mouth at bedtime. ) 90 capsule 3  . fluticasone (FLONASE) 50 MCG/ACT nasal spray Place into the nose.    Marland Kitchen oxymetazoline (NASAL RELIEF) 0.05 % nasal spray Place 1-2 sprays into both nostrils 2 (two) times daily as needed for congestion.    . polyethylene glycol powder (GLYCOLAX/MIRALAX) powder Take 17 g by mouth 2 (two) times daily. (Patient taking differently: Take 17 g by mouth daily. ) 3350 g 1  . PREMARIN vaginal cream Place vaginally 2 (two) times a week. (Patient taking differently: Place 1 Applicatorful vaginally 2 (two) times a week. ) 30 g 1  . sodium chloride (OCEAN) 0.65 % SOLN nasal spray Place 1-2 sprays into both nostrils daily as needed for congestion.    . vitamin C (ASCORBIC ACID) 500 MG tablet Take 500 mg by  mouth daily.    . Estradiol 10 % CREA Place 0.5 g vaginally once a week. 1 Bottle 3   No current facility-administered medications for this visit.     Review of Systems  Constitutional: Negative for chills, fatigue, fever and unexpected weight change.  HENT: Negative for trouble swallowing.  Eyes: Negative for loss of vision.  Respiratory: Negative for cough, shortness of breath and wheezing.  Cardiovascular: Negative for chest pain, leg swelling, palpitations and syncope.  GI: Negative for abdominal pain, blood in stool, diarrhea, nausea and vomiting.  GU: Negative for difficulty urinating, dysuria, frequency and hematuria.  Musculoskeletal: Negative for  back pain, leg pain and joint pain.  Skin: Negative for rash.  Neurological: Negative for dizziness, headaches, light-headedness, numbness and seizures.  Psychiatric: Negative for behavioral problem, confusion, depressed mood and sleep disturbance.        Objective:  Objective   Vitals:   07/23/17 1436  BP: 130/80  Pulse: 87  Weight: 119 lb (54 kg)  Height: 5\' 4"  (1.626 m)   Body mass index is 20.43 kg/m.  Physical Exam  Constitutional: She is oriented to person, place, and time. She appears well-developed and well-nourished.  HENT:  Head: Normocephalic and atraumatic.  Eyes: EOM are normal.  Cardiovascular: Normal rate, regular rhythm and normal heart sounds.  Pulmonary/Chest: Effort normal. No respiratory distress. She has no wheezes.  Genitourinary:    Genitourinary Comments: Atrophic vulva, small erythema, Cervix grossly normal with a small cervical polyp seen.   Neurological: She is alert and oriented to person, place, and time.  Skin: Skin is warm and dry.  Psychiatric: She has a normal mood and affect. Her behavior is normal. Judgment and thought content normal.  Nursing note and vitals reviewed.       Assessment/Plan:    Small cervical polyp, non-concerning. Does not need removal or intervention at this time.   Sent prescription for twice a week estrogen cream for the patient's vulvar atrophy. Homero Fellers MD [facility] 07/27/17 2:37 PM

## 2017-07-24 ENCOUNTER — Ambulatory Visit: Payer: Self-pay | Admitting: *Deleted

## 2017-07-24 ENCOUNTER — Encounter: Payer: Self-pay | Admitting: Family Medicine

## 2017-07-24 NOTE — Telephone Encounter (Signed)
This encounter was created in error - please disregard.

## 2017-07-24 NOTE — Telephone Encounter (Signed)
Result note read to patient; verbalizes understanding. Pt does not recall taking any cholesterol medication previously.   Result note was not routed to [facility].

## 2017-07-27 ENCOUNTER — Encounter: Payer: Self-pay | Admitting: Obstetrics and Gynecology

## 2017-08-24 ENCOUNTER — Telehealth: Payer: Self-pay | Admitting: Obstetrics and Gynecology

## 2017-08-24 NOTE — Telephone Encounter (Signed)
Pt came to office today asking if she could use her estradiol cream more often bc she felt a burning sensation. Advised directions per last note stated to use twice weekly just as she did with premarin cream but rx states once weekly. Pt also wants to know amount of grams she is supposed to use. One applicatorful is 4 gm and her rx said 0.5 gm which she feels like is barely anything. Pt aware CRS out of the office and will update her next week. Please advise. Thank you.

## 2017-08-24 NOTE — Telephone Encounter (Signed)
Patient came by office today inquiring if she could take the estradiol cream more than prescribed or if there is something else she could take?  Please call pt and advise

## 2017-08-26 DIAGNOSIS — H6691 Otitis media, unspecified, right ear: Secondary | ICD-10-CM | POA: Diagnosis not present

## 2017-08-26 DIAGNOSIS — H608X1 Other otitis externa, right ear: Secondary | ICD-10-CM | POA: Diagnosis not present

## 2017-08-26 DIAGNOSIS — H6121 Impacted cerumen, right ear: Secondary | ICD-10-CM | POA: Diagnosis not present

## 2017-08-31 DIAGNOSIS — H6123 Impacted cerumen, bilateral: Secondary | ICD-10-CM | POA: Diagnosis not present

## 2017-08-31 DIAGNOSIS — S00412A Abrasion of left ear, initial encounter: Secondary | ICD-10-CM | POA: Diagnosis not present

## 2017-09-11 DIAGNOSIS — H2512 Age-related nuclear cataract, left eye: Secondary | ICD-10-CM | POA: Diagnosis not present

## 2017-09-18 ENCOUNTER — Encounter: Payer: Self-pay | Admitting: *Deleted

## 2017-09-19 DIAGNOSIS — S00412A Abrasion of left ear, initial encounter: Secondary | ICD-10-CM | POA: Diagnosis not present

## 2017-09-25 ENCOUNTER — Encounter: Payer: Self-pay | Admitting: Emergency Medicine

## 2017-09-25 ENCOUNTER — Ambulatory Visit: Payer: Medicare Other | Admitting: Anesthesiology

## 2017-09-25 ENCOUNTER — Encounter
Admission: RE | Disposition: A | Discharge status: Home / Self Care | Payer: Self-pay | Source: Ambulatory Visit | Attending: Ophthalmology

## 2017-09-25 ENCOUNTER — Ambulatory Visit
Admission: RE | Admit: 2017-09-25 | Discharge: 2017-09-25 | Disposition: A | Discharge status: Home / Self Care | Payer: Medicare Other | Source: Ambulatory Visit | Attending: Ophthalmology | Admitting: Ophthalmology

## 2017-09-25 DIAGNOSIS — H2512 Age-related nuclear cataract, left eye: Secondary | ICD-10-CM | POA: Diagnosis not present

## 2017-09-25 HISTORY — PX: CATARACT EXTRACTION W/PHACO: SHX586

## 2017-09-25 SURGERY — PHACOEMULSIFICATION, CATARACT, WITH IOL INSERTION
Anesthesia: Monitor Anesthesia Care | Site: Eye | Laterality: Left | Wound class: "Clean "

## 2017-09-25 MED ORDER — POVIDONE-IODINE 5 % OP SOLN
OPHTHALMIC | Status: DC | PRN
  Administered 2017-09-25: 1 via OPHTHALMIC

## 2017-09-25 MED ORDER — NA CHONDROIT SULF-NA HYALURON 40-30 MG/ML IO SOLN
INTRAOCULAR | Status: AC
  Filled 2017-09-25: qty 0.5

## 2017-09-25 MED ORDER — EPINEPHRINE PF 1 MG/ML IJ SOLN
INTRAMUSCULAR | Status: AC
  Filled 2017-09-25: qty 1

## 2017-09-25 MED ORDER — NEOMYCIN-POLYMYXIN-DEXAMETH 0.1 % OP OINT
TOPICAL_OINTMENT | OPHTHALMIC | Status: DC | PRN
  Administered 2017-09-25: 1 via OPHTHALMIC

## 2017-09-25 MED ORDER — POVIDONE-IODINE 5 % OP SOLN
OPHTHALMIC | Status: AC
  Filled 2017-09-25: qty 30

## 2017-09-25 MED ORDER — MOXIFLOXACIN HCL 0.5 % OP SOLN
1.0000 [drp] | OPHTHALMIC | Status: DC | PRN
  Administered 2017-09-25: 1 [drp] via OPHTHALMIC

## 2017-09-25 MED ORDER — LIDOCAINE HCL (PF) 4 % IJ SOLN
INTRAMUSCULAR | Status: AC
  Filled 2017-09-25: qty 5

## 2017-09-25 MED ORDER — FENTANYL CITRATE (PF) 100 MCG/2ML IJ SOLN
INTRAMUSCULAR | Status: DC | PRN
  Administered 2017-09-25: 25 ug via INTRAVENOUS

## 2017-09-25 MED ORDER — MOXIFLOXACIN HCL 0.5 % OP SOLN
OPHTHALMIC | Status: AC
  Administered 2017-09-25: 1 [drp] via OPHTHALMIC
  Filled 2017-09-25: qty 6

## 2017-09-25 MED ORDER — LIDOCAINE HCL (PF) 4 % IJ SOLN
INTRAMUSCULAR | Status: DC | PRN
  Administered 2017-09-25: 2 mL via OPHTHALMIC

## 2017-09-25 MED ORDER — NA HYALUR & NA CHOND-NA HYALUR 0.55-0.5 ML IO KIT
PACK | INTRAOCULAR | Status: AC
  Filled 2017-09-25: qty 1.05

## 2017-09-25 MED ORDER — FENTANYL CITRATE (PF) 100 MCG/2ML IJ SOLN
INTRAMUSCULAR | Status: AC
  Filled 2017-09-25: qty 2

## 2017-09-25 MED ORDER — EPINEPHRINE PF 1 MG/ML IJ SOLN
INTRAOCULAR | Status: DC | PRN
  Administered 2017-09-25: 1 mL via OPHTHALMIC

## 2017-09-25 MED ORDER — CARBACHOL 0.01 % IO SOLN
INTRAOCULAR | Status: DC | PRN
  Administered 2017-09-25: .5 mL via INTRAOCULAR

## 2017-09-25 MED ORDER — ARMC OPHTHALMIC DILATING DROPS
1.0000 "application " | OPHTHALMIC | Status: AC
  Administered 2017-09-25 (×3): 1 via OPHTHALMIC

## 2017-09-25 MED ORDER — ARMC OPHTHALMIC DILATING DROPS
OPHTHALMIC | Status: AC
  Administered 2017-09-25: 1 via OPHTHALMIC
  Filled 2017-09-25: qty 0.4

## 2017-09-25 MED ORDER — MIDAZOLAM HCL 2 MG/2ML IJ SOLN
INTRAMUSCULAR | Status: DC | PRN
  Administered 2017-09-25: 1 mg via INTRAVENOUS

## 2017-09-25 MED ORDER — MOXIFLOXACIN HCL 0.5 % OP SOLN
1.0000 [drp] | OPHTHALMIC | Status: AC
  Administered 2017-09-25 (×2): 1 [drp] via OPHTHALMIC

## 2017-09-25 MED ORDER — NA HYALUR & NA CHOND-NA HYALUR 0.4-0.35 ML IO KIT
PACK | INTRAOCULAR | Status: DC | PRN
  Administered 2017-09-25: 1 mL via INTRAOCULAR

## 2017-09-25 MED ORDER — MIDAZOLAM HCL 2 MG/2ML IJ SOLN
INTRAMUSCULAR | Status: AC
  Filled 2017-09-25: qty 2

## 2017-09-25 MED ORDER — SODIUM CHLORIDE 0.9 % IV SOLN
INTRAVENOUS | Status: DC
  Administered 2017-09-25: 09:00:00 via INTRAVENOUS

## 2017-09-25 SURGICAL SUPPLY — 18 items
GLOVE BIO SURGEON STRL SZ8 (GLOVE) ×2 IMPLANT
GLOVE BIOGEL M 6.5 STRL (GLOVE) ×2 IMPLANT
GLOVE SURG LX 7.5 STRW (GLOVE) ×1
GLOVE SURG LX STRL 7.5 STRW (GLOVE) ×1 IMPLANT
GOWN STRL REUS W/ TWL LRG LVL3 (GOWN DISPOSABLE) ×2 IMPLANT
GOWN STRL REUS W/TWL LRG LVL3 (GOWN DISPOSABLE) ×2
LABEL CATARACT MEDS ST (LABEL) ×2 IMPLANT
LENS IOL TECNIS ITEC 22.5 (Intraocular Lens) ×1 IMPLANT
NDL HPO THNWL 1X22GA REG BVL (NEEDLE) ×1 IMPLANT
NEEDLE SAFETY 22GX1 (NEEDLE) ×1
PACK CATARACT (MISCELLANEOUS) ×2 IMPLANT
PACK CATARACT BRASINGTON LX (MISCELLANEOUS) ×2 IMPLANT
PACK EYE AFTER SURG (MISCELLANEOUS) ×2 IMPLANT
SOL BSS BAG (MISCELLANEOUS) ×2
SOLUTION BSS BAG (MISCELLANEOUS) ×1 IMPLANT
SYR 5ML LL (SYRINGE) ×2 IMPLANT
WATER STERILE IRR 250ML POUR (IV SOLUTION) ×2 IMPLANT
WIPE NON LINTING 3.25X3.25 (MISCELLANEOUS) ×2 IMPLANT

## 2017-09-25 NOTE — H&P (Signed)
The History and Physical notes are on paper, have been signed, and are to be scanned. The patient remains stable and unchanged from the H&P.   Previous H&P reviewed, patient examined, and there are no changes.  Carol Stark 09/25/2017 9:03 AM

## 2017-09-25 NOTE — Anesthesia Post-op Follow-up Note (Signed)
Anesthesia QCDR form completed.        

## 2017-09-25 NOTE — Transfer of Care (Signed)
Immediate Anesthesia Transfer of Care Note  Patient: Carol Stark  Procedure(s) Performed: CATARACT EXTRACTION PHACO AND INTRAOCULAR LENS PLACEMENT (IOC) (Left Eye)  Patient Location: PACU  Anesthesia Type:MAC  Level of Consciousness: awake, alert  and oriented  Airway & Oxygen Therapy: Patient Spontanous Breathing  Post-op Assessment: Report given to RN and Post -op Vital signs reviewed and stable  Post vital signs: Reviewed and stable  Last Vitals:  Vitals Value Taken Time  BP    Temp    Pulse    Resp    SpO2      Last Pain:  Vitals:   09/25/17 0852  TempSrc: Tympanic  PainSc: 0-No pain         Complications: No apparent anesthesia complications

## 2017-09-25 NOTE — Discharge Instructions (Signed)
Eye Surgery Discharge Instructions  Expect mild scratchy sensation or mild soreness. DO NOT RUB YOUR EYE!  The day of surgery:  Minimal physical activity, but bed rest is not required  No reading, computer work, or close hand work  No bending, lifting, or straining.  May watch TV  For 24 hours:  No driving, legal decisions, or alcoholic beverages  Safety precautions  Eat anything you prefer: It is better to start with liquids, then soup then solid foods.  _____ Eye patch should be worn until postoperative exam tomorrow.  ____ Solar shield eyeglasses should be worn for comfort in the sunlight/patch while sleeping  Resume all regular medications including aspirin or Coumadin if these were discontinued prior to surgery. You may shower, bathe, shave, or wash your hair. Tylenol may be taken for mild discomfort.  Call your doctor if you experience significant pain, nausea, or vomiting, fever > 101 or other signs of infection. [phone] or [phone] Specific instructions:  Follow-up Information    Leandrew Koyanagi, MD Follow up on 09/25/2017.   Specialty:  Ophthalmology Why:  appointment time 3:30 PM Contact information: [address]   [place] [address] [phone]

## 2017-09-25 NOTE — Anesthesia Postprocedure Evaluation (Signed)
Anesthesia Post Note  Patient: Carol Stark  Procedure(s) Performed: CATARACT EXTRACTION PHACO AND INTRAOCULAR LENS PLACEMENT (IOC) (Left Eye)  Patient location during evaluation: PACU Anesthesia Type: MAC Level of consciousness: awake and alert Pain management: pain level controlled Vital Signs Assessment: post-procedure vital signs reviewed and stable Respiratory status: spontaneous breathing Cardiovascular status: blood pressure returned to baseline and stable Anesthetic complications: no     Last Vitals:  Vitals:   09/25/17 0852  BP: (!) 161/89  Pulse: 90  Resp: 17  Temp: (!) 35.8 C  SpO2: 100%    Last Pain:  Vitals:   09/25/17 0852  TempSrc: Tympanic  PainSc: 0-No pain                 Ether Goebel Marilynn Rail

## 2017-09-25 NOTE — Op Note (Signed)
OPERATIVE NOTE  Carol Stark [id number] 09/25/2017   PREOPERATIVE DIAGNOSIS:  Nuclear sclerotic cataract left eye. H25.12   POSTOPERATIVE DIAGNOSIS:    Nuclear sclerotic cataract left eye.     PROCEDURE:  Phacoemusification with posterior chamber intraocular lens placement of the left eye   LENS:   Implant Name Type Inv. Item Serial No. Manufacturer Lot No. LRB No. Used  LENS IOL DIOP 22.5 - [id number] Intraocular Lens LENS IOL DIOP 22.5 [id number] AMO  Left 1        ULTRASOUND TIME: 14 % of 0 minutes, 46 seconds.  CDE 6.7   SURGEON:  Wyonia Hough, MD   ANESTHESIA:  Topical with tetracaine drops and 2% Xylocaine jelly, augmented with 1% preservative-free intracameral lidocaine.    COMPLICATIONS:  None.   DESCRIPTION OF PROCEDURE:  The patient was identified in the holding room and transported to the operating room and placed in the supine position under the operating microscope.  The left eye was identified as the operative eye and it was prepped and draped in the usual sterile ophthalmic fashion.   A 1 millimeter clear-corneal paracentesis was made at the 1:30 position. 0.5 ml of preservative-free 1% lidocaine was injected into the anterior chamber.  The anterior chamber was filled with Viscoat viscoelastic.  A 2.4 millimeter keratome was used to make a near-clear corneal incision at the 10:30 position.  .  A curvilinear capsulorrhexis was made with a cystotome and capsulorrhexis forceps.  Balanced salt solution was used to hydrodissect and hydrodelineate the nucleus.   Phacoemulsification was then used in stop and chop fashion to remove the lens nucleus and epinucleus.  The remaining cortex was then removed using the irrigation and aspiration handpiece. Provisc was then placed into the capsular bag to distend it for lens placement.  A lens was then injected into the capsular bag.  The remaining viscoelastic was aspirated.   Wounds were hydrated with balanced  salt solution.  The anterior chamber was inflated to a physiologic pressure with balanced salt solution. Vigamox 0.2 ml of a 1mg  per ml solution was injected into the anterior chamber for a dose of 0.2 mg of intracameral antibiotic at the completion of the case.  Miostat was placed into the anterior chamber to constrict the pupil.  No wound leaks were noted.  Topical Vigamox drops and Maxitrol ointment were applied to the eye.  The patient was taken to the recovery room in stable condition without complications of anesthesia or surgery  Carol Stark 09/25/2017, 10:24 AM

## 2017-09-25 NOTE — Anesthesia Preprocedure Evaluation (Signed)
Anesthesia Evaluation  Patient identified by MRN, date of birth, ID band Patient awake    Reviewed: Allergy & Precautions, H&P , NPO status , Patient's Chart, lab work & pertinent test results  History of Anesthesia Complications Negative for: history of anesthetic complications  Airway Mallampati: III  TM Distance: <3 FB Neck ROM: limited    Dental  (+) Chipped, Poor Dentition, Missing, Partial Lower   Pulmonary neg pulmonary ROS, neg shortness of breath,           Cardiovascular Exercise Tolerance: Good negative cardio ROS       Neuro/Psych negative neurological ROS  negative psych ROS   GI/Hepatic Neg liver ROS, GERD  Medicated and Controlled,  Endo/Other  negative endocrine ROS  Renal/GU      Musculoskeletal  (+) Arthritis ,   Abdominal   Peds  Hematology negative hematology ROS (+)   Anesthesia Other Findings Past Medical History: No date: Allergic rhinitis No date: Arthritis No date: GERD (gastroesophageal reflux disease) No date: History of chicken pox No date: UTI (lower urinary tract infection)  Past Surgical History: No date: COLONOSCOPY No date: TONSILLECTOMY  BMI    Body Mass Index:  20.60 kg/m      Reproductive/Obstetrics negative OB ROS                             Anesthesia Physical  Anesthesia Plan  ASA: II  Anesthesia Plan: MAC   Post-op Pain Management:    Induction: Intravenous  PONV Risk Score and Plan:   Airway Management Planned: Natural Airway and Nasal Cannula  Additional Equipment:   Intra-op Plan:   Post-operative Plan:   Informed Consent: I have reviewed the patients History and Physical, chart, labs and discussed the procedure including the risks, benefits and alternatives for the proposed anesthesia with the patient or authorized representative who has indicated his/her understanding and acceptance.   Dental Advisory  Given  Plan Discussed with: Anesthesiologist, CRNA and Surgeon  Anesthesia Plan Comments: (Patient consented for risks of anesthesia including but not limited to:  - adverse reactions to medications - damage to teeth, lips or other oral mucosa - sore throat or hoarseness - Damage to heart, brain, lungs or loss of life  Patient voiced understanding.)        Anesthesia Quick Evaluation

## 2017-10-09 ENCOUNTER — Other Ambulatory Visit: Payer: Self-pay | Admitting: Family Medicine

## 2017-10-30 ENCOUNTER — Ambulatory Visit (INDEPENDENT_AMBULATORY_CARE_PROVIDER_SITE_OTHER): Payer: Medicare Other

## 2017-10-30 VITALS — BP 110/70 | HR 86 | Temp 98.2°F | Resp 14 | Ht 62.0 in | Wt 119.8 lb

## 2017-10-30 DIAGNOSIS — Z0000 Encounter for general adult medical examination without abnormal findings: Secondary | ICD-10-CM | POA: Diagnosis not present

## 2017-10-30 NOTE — Progress Notes (Signed)
Subjective:   Carol Stark is a 84 y.o. female who presents for an Initial Medicare Annual Wellness Visit.  Review of Systems    No ROS.  Medicare Wellness Visit. Additional risk factors are reflected in the social history.  Cardiac Risk Factors include: advanced age (>32men, >8 women)     Objective:    Today's Vitals   10/30/17 1041  BP: 110/70  Pulse: 86  Resp: 14  Temp: 98.2 F (36.8 C)  TempSrc: Oral  SpO2: 97%  Weight: 119 lb 12.8 oz (54.3 kg)  Height: 5\' 2"  (1.575 m)   Body mass index is 21.91 kg/m.  Advanced Directives 10/30/2017 09/25/2017 06/21/2017 07/11/2016  Does Patient Have a Medical Advance Directive? Yes Yes Yes Yes  Type of Paramedic of [place];Living will [place];Living will [place];Living will  Does patient want to make changes to medical advance directive? No - Patient declined No - Patient declined No - Patient declined -  Copy of [place] in Chart? No - copy requested Yes No - copy requested -    Current Medications (verified) Outpatient Encounter Medications as of 10/30/2017  Medication Sig  . B Complex Vitamins (B COMPLEX PO) Take 1 tablet by mouth daily.   . Calcium Carb-Cholecalciferol (CALCIUM 1000 + D PO) Take 1 tablet by mouth daily.   . calcium carbonate (TUMS - DOSED IN MG ELEMENTAL CALCIUM) 500 MG chewable tablet Chew 1 tablet by mouth 2 (two) times daily.   . Carboxymethylcell-Hypromellose (GENTEAL OP) Place 1 drop into both eyes 2 (two) times daily.  . Cholecalciferol (VITAMIN D-3 PO) Take 1 capsule by mouth daily.   Marland Kitchen esomeprazole (NEXIUM) 40 MG capsule TAKE 1 CAPSULE (40 MG TOTAL) BY MOUTH DAILY BEFORE BREAKFAST.  Marland Kitchen estradiol (ESTRACE) 0.1 MG/GM vaginal cream Place 0.5 g vaginally once a week.  . fluticasone (FLONASE) 50 MCG/ACT nasal spray Place into the nose.  Marland Kitchen oxymetazoline (NASAL RELIEF) 0.05 % nasal spray  Place 1-2 sprays into both nostrils 2 (two) times daily as needed for congestion.  . polyethylene glycol powder (GLYCOLAX/MIRALAX) powder Take 17 g by mouth 2 (two) times daily. (Patient taking differently: Take 17 g by mouth daily. )  . PREMARIN vaginal cream Place vaginally 2 (two) times a week. (Patient taking differently: Place 1 Applicatorful vaginally 2 (two) times a week. )  . sodium chloride (OCEAN) 0.65 % SOLN nasal spray Place 1-2 sprays into both nostrils daily as needed for congestion.  . vitamin C (ASCORBIC ACID) 500 MG tablet Take 500 mg by mouth daily.  . [DISCONTINUED] B Complex Vitamins (VITAMIN B COMPLEX 100 IJ) Take by mouth.   No facility-administered encounter medications on file as of 10/30/2017.     Allergies (verified) Biaxin [clarithromycin]   History: Past Medical History:  Diagnosis Date  . Allergic rhinitis   . Arthritis   . GERD (gastroesophageal reflux disease)   . History of chicken pox   . UTI (lower urinary tract infection)    Past Surgical History:  Procedure Laterality Date  . CATARACT EXTRACTION W/PHACO Right 06/21/2017   Procedure: CATARACT EXTRACTION PHACO AND INTRAOCULAR LENS PLACEMENT (IOC);  Surgeon: Leandrew Koyanagi, MD;  Location: [facility] ORS;  Service: Ophthalmology;  Laterality: Right;  [place] 00:55.2 AP% 14.4 CDE 7.93 Fluid Pack Lot # [id number]  . CATARACT EXTRACTION W/PHACO Left 09/25/2017   Procedure: CATARACT EXTRACTION PHACO AND INTRAOCULAR LENS PLACEMENT (IOC);  Surgeon: Leandrew Koyanagi,  MD;  Location: [facility] ORS;  Service: Ophthalmology;  Laterality: Left;  [place] 00:46 AP% 14.2 CDE 6.65 Fluid pack lot # [id number]  . COLONOSCOPY    . TONSILLECTOMY     Family History  Problem Relation Age of Onset  . Arthritis Mother   . Heart disease Father    Social History   Socioeconomic History  . Marital status: Single    Spouse name: Not on file  . Number of children: Not on file  . Years of education: Not on file  . Highest education  level: Not on file  Occupational History  . Not on file  Social Needs  . Financial resource strain: Not hard at all  . Food insecurity:    Worry: Never true    Inability: Never true  . Transportation needs:    Medical: No    Non-medical: No  Tobacco Use  . Smoking status: Never Smoker  . Smokeless tobacco: Never Used  Substance and Sexual Activity  . Alcohol use: Yes    Alcohol/week: 0.0 oz  . Drug use: No  . Sexual activity: Not Currently    Birth control/protection: Post-menopausal  Lifestyle  . Physical activity:    Days per week: 0 days    Minutes per session: 0 min  . Stress: Only a little  Relationships  . Social connections:    Talks on phone: Not on file    Gets together: Not on file    Attends religious service: Not on file    Active member of club or organization: Not on file    Attends meetings of clubs or organizations: Not on file    Relationship status: Not on file  Other Topics Concern  . Not on file  Social History Narrative  . Not on file    Tobacco Counseling Counseling given: Not Answered   Clinical Intake:  Pre-visit preparation completed: Yes        Nutritional Status: BMI of 19-24  Normal Diabetes: No  How often do you need to have someone help you when you read instructions, pamphlets, or other written materials from your doctor or pharmacy?: 1 - Never  Interpreter Needed?: No      Activities of Daily Living In your present state of health, do you have any difficulty performing the following activities: 10/30/2017  Hearing? N  Vision? N  Difficulty concentrating or making decisions? Y  Comment Reports she has some difficulty remembering names.   Walking or climbing stairs? N  Dressing or bathing? N  Doing errands, shopping? N  Preparing Food and eating ? N  Using the Toilet? N  In the past six months, have you accidently leaked urine? N  Do you have problems with loss of bowel control? N  Managing your Medications? N    Managing your Finances? Y  Comment Friend assists with finance management.   Housekeeping or managing your Housekeeping? N  Some recent data might be hidden     Immunizations and Health Maintenance Immunization History  Administered Date(s) Administered  . Influenza-Unspecified 01/10/2016   Health Maintenance Due  Topic Date Due  . TETANUS/TDAP  03/04/1953  . DEXA SCAN  03/05/1999  . PNA vac Low Risk Adult (1 of 2 - PCV13) 03/05/1999    Patient Care Team: Leone Haven, MD as PCP - General (Family Medicine)  Indicate any recent Medical Services you may have received from other than [facility] providers in the past year (date may be approximate).  Assessment:   This is a routine wellness examination for [facility]. The goal of the wellness visit is to assist the patient how to close the gaps in care and create a preventative care plan for the patient.   The roster of all physicians providing medical care to patient is listed in the Snapshot section of the chart.  Taking calcium VIT D as appropriate/Osteoporosis risk reviewed.    Safety issues reviewed; Smoke and carbon monoxide detectors in the home. Safety pull cords in place. No firearms in the home. Wears seatbelts when driving or riding with others. No violence in the home.  Resides at the [place].   They do not have excessive sun exposure.  Discussed the need for sun protection: hats, long sleeves and the use of sunscreen if there is significant sun exposure.  Patient is alert, normal in appearance.  Correctly identified the president of the [place] and recalls 1/3 words. Counted backwards from 20 to 1.  Incorrectly read time from clock face. Correctly stated the months of the year in reverse. Brain engagement activities encouraged.  Educational material provided.   No failures at ADL's or IADL's.   BMI- discussed the importance of a healthy diet, water intake and the benefits of aerobic exercise. States  her appetite is good and she overall feels good.  Plans to increase her walking for exercise and has adequate fluid intake.    Dental- UTD.  Sleep patterns- Sleeps 6-7 hours at night.  Wakes feeling rested.    Dexa Scan, Pneumococcal and TDAP vaccine discussed. States she is UTD with health maintenance and record from her last physician has been requested for abstract.   Patient Concerns:continuing to pass gas.  She takes Miralax at noon and notices gas late afternoon, then believes it stops after BM.  Follow up appointment declined. She will start taking it at bedtime and monitor. If gas does not taper after BM in the morning she will schedule with pcp for follow up.    Hearing/Vision screen Hearing Screening Comments: Patient is able to hear conversational tones without difficulty.  No issues reported.   Vision Screening Comments: Followed by [facility] Wears corrective lenses when reading Last OV 10/2017 Cataract extraction, bilateral Visual acuity not assessed per patient preference since they have regular follow up with the ophthalmologist  Dietary issues and exercise activities discussed: Current Exercise Habits: Home exercise routine, Type of exercise: walking;yoga, Time (Minutes): 60, Frequency (Times/Week): 2, Weekly Exercise (Minutes/Week): 120, Intensity: Mild  Goals    . Brain Activity Engagement     Read more, at least 30 minutes per day.  Word search puzzles Socialize with others in your community; walking or eating in the dining area etc.. Adult coloring books and painting activities     . Increase physical activity     Walk more for exercise      Depression Screen PHQ 2/9 Scores 10/30/2017 07/10/2017 06/05/2016  PHQ - 2 Score 0 0 0    Fall Risk Fall Risk  10/30/2017 07/10/2017 06/05/2016  Falls in the past year? No No No   Cognitive Function:     6CIT Screen 10/30/2017  What Year? 0 points  What month? 0 points  What time? 3 points  Count back from  20 0 points  Months in reverse 0 points  Repeat phrase 0 points  Total Score 3    Screening Tests Health Maintenance  Topic Date Due  . TETANUS/TDAP  03/04/1953  . DEXA SCAN  03/05/1999  . PNA vac Low Risk Adult (1 of 2 - PCV13) 03/05/1999  . INFLUENZA VACCINE  11/08/2017     Plan:    End of life planning; Advance aging; Advanced directives discussed. Copy of current HCPOA/Living Will requested.    I have personally reviewed and noted the following in the patient's chart:   . Medical and social history . Use of alcohol, tobacco or illicit drugs  . Current medications and supplements . Functional ability and status . Nutritional status . Physical activity . Advanced directives . List of other physicians . Hospitalizations, surgeries, and ER visits in previous 12 months . Vitals . Screenings to include cognitive, depression, and falls . Referrals and appointments  In addition, I have reviewed and discussed with patient certain preventive protocols, quality metrics, and best practice recommendations. A written personalized care plan for preventive services as well as general preventive health recommendations were provided to patient.     Varney Biles, LPN   06/19/8116

## 2017-10-30 NOTE — Patient Instructions (Addendum)
  Ms. Brownlee , Thank you for taking time to come for your Medicare Wellness Visit. I appreciate your ongoing commitment to your health goals. Please review the following plan we discussed and let me know if I can assist you in the future.   These are the goals we discussed: Goals    . Brain Activity Engagement     Read more, at least 30 minutes per day.  Word search puzzles Socialize with others in your community; walking or eating in the dining area etc.. Adult coloring books and painting activities     . Increase physical activity     Walk more for exercise       This is a list of the screening recommended for you and due dates:  Health Maintenance  Topic Date Due  . Tetanus Vaccine  03/04/1953  . DEXA scan (bone density measurement)  03/05/1999  . Pneumonia vaccines (1 of 2 - PCV13) 03/05/1999  . Flu Shot  11/08/2017

## 2017-11-13 DIAGNOSIS — H169 Unspecified keratitis: Secondary | ICD-10-CM | POA: Diagnosis not present

## 2017-11-22 ENCOUNTER — Encounter: Payer: Self-pay | Admitting: Family Medicine

## 2017-11-22 ENCOUNTER — Ambulatory Visit (INDEPENDENT_AMBULATORY_CARE_PROVIDER_SITE_OTHER): Payer: Medicare Other | Admitting: Family Medicine

## 2017-11-22 VITALS — BP 126/70 | HR 97 | Temp 98.3°F | Resp 15 | Wt 120.2 lb

## 2017-11-22 DIAGNOSIS — N3001 Acute cystitis with hematuria: Secondary | ICD-10-CM

## 2017-11-22 DIAGNOSIS — R300 Dysuria: Secondary | ICD-10-CM

## 2017-11-22 LAB — POC URINALSYSI DIPSTICK (AUTOMATED)
Bilirubin, UA: NEGATIVE
Glucose, UA: NEGATIVE
Ketones, UA: NEGATIVE
NITRITE UA: NEGATIVE
Protein, UA: POSITIVE — AB
Spec Grav, UA: 1.025 (ref 1.010–1.025)
Urobilinogen, UA: 0.2 E.U./dL
pH, UA: 5.5 (ref 5.0–8.0)

## 2017-11-22 MED ORDER — CEPHALEXIN 500 MG PO CAPS: 500.0000 mg | ORAL_CAPSULE | Freq: Two times a day (BID) | ORAL | 0 refills | Status: AC

## 2017-11-22 NOTE — Patient Instructions (Signed)
Great to meet you! 

## 2017-11-22 NOTE — Progress Notes (Signed)
   Subjective:    Patient ID: Carol Stark, female    DOB: [birth year], 84 y.o.   MRN: [id number]  HPI   Patient presents to clinic c/o UTI symptoms for 3 days. She has had burning with urination and increased frequency for 3 days.    Patient Active Problem List   Diagnosis Date Noted  . Abnormal appearance of cervix 07/10/2017  . Cataracts, bilateral 07/10/2017  . Passing gas 07/10/2017  . Left hip pain 04/11/2017  . BPPV (benign paroxysmal positional vertigo) 06/29/2016  . Allergic rhinitis 06/05/2016  . Postmenopausal atrophic vaginitis 06/05/2016  . History of hyperlipidemia 02/23/2016  . GERD (gastroesophageal reflux disease) 11/08/2015  . Basal cell carcinoma 10/25/2015  . Constipation 10/22/2015   Social History   Tobacco Use  . Smoking status: Never Smoker  . Smokeless tobacco: Never Used  Substance Use Topics  . Alcohol use: Yes    Alcohol/week: 0.0 standard drinks   Review of Systems Review of Systems  Constitutional: Negative for chills, fatigue and fever.  HENT: Negative for congestion, ear pain, sinus pain and sore throat.   Eyes: Negative.   Respiratory: Negative for cough, shortness of breath and wheezing.   Cardiovascular: Negative for chest pain, palpitations and leg swelling.  Gastrointestinal: Negative for abdominal pain, diarrhea, nausea and vomiting.  Genitourinary: Positive for dysuria, frequency and urgency.  Musculoskeletal: Negative for arthralgias and myalgias.  Skin: Negative for color change, pallor and rash.  Neurological: Negative for syncope, light-headedness and headaches.  Psychiatric/Behavioral: The patient is not nervous/anxious.    Objective:   Physical Exam  Constitutional: She appears well-developed and well-nourished. No distress.  Head: Normocephalic and atraumatic.  Eyes: EOM are normal. No scleral icterus.  Neck: Normal range of motion. Neck supple. No tracheal deviation present.  Cardiovascular: Normal rate,  regular rhythm and normal heart sounds.  Pulmonary/Chest: Effort normal and breath sounds normal. No respiratory distress.  Abdominal: Soft. Bowel sounds are normal.  Mild suprapubic tenderness. Neurological: She is alert and oriented to person, place, and time.  Gait normal  Skin: Skin is warm and dry. No pallor.  Psychiatric: She has a normal mood and affect. Her behavior is normal.   Nursing note and vitals reviewed.  Vitals:   11/22/17 1334  BP: 126/70  Pulse: 97  Resp: 15  Temp: 98.3 F (36.8 C)  SpO2: 97%       Assessment & Plan:    UTI -  Keflex course twice daily for 5 days.  Urine culture collected and sent to lab, we will call patient with results of urine culture when available.  Dysuria - Increase water intake. Avoid excess sugary/caffenated beverages.   Keep regular follow up appt 01/30/18 as scheduled

## 2017-11-24 LAB — URINE CULTURE
MICRO NUMBER: 90971820
SPECIMEN QUALITY: ADEQUATE

## 2017-11-28 MED ORDER — PHENAZOPYRIDINE HCL 100 MG PO TABS: 100.0000 mg | ORAL_TABLET | Freq: Three times a day (TID) | ORAL | 0 refills | Status: DC | PRN

## 2017-11-28 NOTE — Addendum Note (Signed)
Addended by: Philis Nettle on: 11/28/2017 09:40 AM   Modules accepted: Orders

## 2017-11-29 ENCOUNTER — Ambulatory Visit (INDEPENDENT_AMBULATORY_CARE_PROVIDER_SITE_OTHER): Payer: Medicare Other | Admitting: Family Medicine

## 2017-11-29 ENCOUNTER — Encounter: Payer: Self-pay | Admitting: Family Medicine

## 2017-11-29 VITALS — BP 128/82 | HR 95 | Temp 98.4°F | Resp 15 | Wt 119.4 lb

## 2017-11-29 DIAGNOSIS — R300 Dysuria: Secondary | ICD-10-CM

## 2017-11-29 LAB — URINALYSIS, MICROSCOPIC ONLY

## 2017-11-29 LAB — POC URINALSYSI DIPSTICK (AUTOMATED)
Glucose, UA: NEGATIVE
KETONES UA: NEGATIVE
Nitrite, UA: NEGATIVE
PH UA: 5.5 (ref 5.0–8.0)
Protein, UA: POSITIVE — AB
Urobilinogen, UA: NEGATIVE E.U./dL — AB

## 2017-11-29 MED ORDER — PHENAZOPYRIDINE HCL 100 MG PO TABS: 100.0000 mg | ORAL_TABLET | Freq: Three times a day (TID) | ORAL | 0 refills | Status: DC | PRN

## 2017-11-29 NOTE — Patient Instructions (Signed)
Good to see you again!  Increase water intake

## 2017-11-29 NOTE — Progress Notes (Signed)
   Subjective:    Patient ID: Carol Stark, female    DOB: February 04, 1932, 84 y.o.   MRN: [id number]  HPI Patient presents to clinic due to continued minor dysuria symptoms.  States her symptoms DO feel a lot better when she compares them to burning, pressure, frequency she was having last week.  She finished a course of Keflex, and her urine culture that was sent last week did not grow any significant bacteria.  Patient had called yesterday due to continued dysuria, and it was suggested she try over-the-counter Azo, however she states when she went to store she was unsure what to take so decided to come in for reevaluation  Patient Active Problem List   Diagnosis Date Noted  . Abnormal appearance of cervix 07/10/2017  . Cataracts, bilateral 07/10/2017  . Passing gas 07/10/2017  . Left hip pain 04/11/2017  . BPPV (benign paroxysmal positional vertigo) 06/29/2016  . Allergic rhinitis 06/05/2016  . Postmenopausal atrophic vaginitis 06/05/2016  . History of hyperlipidemia 02/23/2016  . GERD (gastroesophageal reflux disease) 11/08/2015  . Basal cell carcinoma 10/25/2015  . Constipation 10/22/2015   Social History   Tobacco Use  . Smoking status: Never Smoker  . Smokeless tobacco: Never Used  Substance Use Topics  . Alcohol use: Yes    Alcohol/week: 0.0 standard drinks   Review of Systems  Constitutional: Negative for chills, fatigue and fever.  HENT: Negative for congestion, ear pain, sinus pain and sore throat.   Eyes: Negative.   Respiratory: Negative for cough, shortness of breath and wheezing.   Cardiovascular: Negative for chest pain, palpitations and leg swelling.  Gastrointestinal: Negative for abdominal pain, diarrhea, nausea and vomiting.  Genitourinary: Postive for dysuria, but improved from last week.   Musculoskeletal: Negative for arthralgias and myalgias.  Skin: Negative for color change, pallor and rash.  Neurological: Negative for syncope, light-headedness  and headaches.  Psychiatric/Behavioral: The patient is not nervous/anxious.       Objective:   Physical Exam  Constitutional: She is oriented to person, place, and time. She appears well-developed and well-nourished. No distress.  HENT:  Head: Normocephalic and atraumatic.  Cardiovascular: Normal rate and regular rhythm.  Pulmonary/Chest: Effort normal and breath sounds normal. No respiratory distress.  Abdominal: Soft. Bowel sounds are normal. There is no tenderness.  Neurological: She is alert and oriented to person, place, and time.  Gait normal.  Psychiatric: She has a normal mood and affect. Her behavior is normal.  Nursing note and vitals reviewed.   Vitals:   11/29/17 1320  BP: 128/82  Pulse: 95  Resp: 15  Temp: 98.4 F (36.9 C)  SpO2: 96%       Assessment & Plan:    Dysuria -- urinalysis dipstick in clinic does show some leukocytes, but patient recently finished Keflex course.  We will send urine out to lab for microscopic urinalysis and also urine culture.  Prescription of Pyridium sent to pharmacy, patient advised to use this 3 times a day as needed for next 3 days to help control bladder spasms/dysuria symptoms.  If the symptoms do not resolve after short Pyridium course, we can consider referral to urology for further evaluation  Keep regularly scheduled follow-up as planned in October 2019

## 2017-11-30 LAB — URINE CULTURE
MICRO NUMBER:: 91003442
SPECIMEN QUALITY: ADEQUATE

## 2017-12-07 ENCOUNTER — Encounter: Payer: Self-pay | Admitting: Family Medicine

## 2017-12-07 ENCOUNTER — Other Ambulatory Visit: Payer: Self-pay

## 2017-12-07 ENCOUNTER — Ambulatory Visit (INDEPENDENT_AMBULATORY_CARE_PROVIDER_SITE_OTHER): Payer: Medicare Other | Admitting: Family Medicine

## 2017-12-07 ENCOUNTER — Ambulatory Visit (INDEPENDENT_AMBULATORY_CARE_PROVIDER_SITE_OTHER): Payer: Medicare Other

## 2017-12-07 VITALS — BP 118/74 | HR 89 | Temp 98.3°F | Wt 120.0 lb

## 2017-12-07 DIAGNOSIS — M25552 Pain in left hip: Secondary | ICD-10-CM

## 2017-12-07 MED ORDER — MENTHOL (TOPICAL ANALGESIC) 4 % EX GEL: 1.0000 "application " | Freq: Four times a day (QID) | CUTANEOUS | 1 refills | Status: DC

## 2017-12-07 NOTE — Progress Notes (Signed)
   Subjective:    Patient ID: Carol Stark, female    DOB: 04-17-1931, 84 y.o.   MRN: [id number]  HPI  Patient presents to clinic complaining of left hip pain.  Denies any fall or known injury.  Noticed the pain beginning after she went on a bus trip and had to remain seated for a couple of hours time.  Patient has not yet tried any over-the-counter pain relief medications.   Patient Active Problem List   Diagnosis Date Noted  . Abnormal appearance of cervix 07/10/2017  . Cataracts, bilateral 07/10/2017  . Passing gas 07/10/2017  . Left hip pain 04/11/2017  . BPPV (benign paroxysmal positional vertigo) 06/29/2016  . Allergic rhinitis 06/05/2016  . Postmenopausal atrophic vaginitis 06/05/2016  . History of hyperlipidemia 02/23/2016  . GERD (gastroesophageal reflux disease) 11/08/2015  . Basal cell carcinoma 10/25/2015  . Constipation 10/22/2015   Social History   Tobacco Use  . Smoking status: Never Smoker  . Smokeless tobacco: Never Used  Substance Use Topics  . Alcohol use: Yes    Alcohol/week: 0.0 standard drinks   Review of Systems   Constitutional: Negative for chills, fatigue and fever.  HENT: Negative for congestion, ear pain, sinus pain and sore throat.   Eyes: Negative.   Respiratory: Negative for cough, shortness of breath and wheezing.   Cardiovascular: Negative for chest pain, palpitations and leg swelling.  Gastrointestinal: Negative for abdominal pain, diarrhea, nausea and vomiting.  Genitourinary: Negative for dysuria, frequency and urgency.  Musculoskeletal: +left hip pain Skin: Negative for color change, pallor and rash.  Neurological: Negative for syncope, light-headedness and headaches.  Psychiatric/Behavioral: The patient is not nervous/anxious    Objective:   Physical Exam  Constitutional: She is oriented to person, place, and time. She appears well-developed and well-nourished. No distress.  HENT:  Head: Normocephalic and atraumatic.    Eyes: No scleral icterus.  Cardiovascular: Normal rate.  Pulmonary/Chest: Effort normal. No respiratory distress.  Musculoskeletal: Normal range of motion. She exhibits no edema or deformity.  Gait normal. Some pain when left leg raised up past 90 degree angle and with abduction of hip. No pain with adduction.   Neurological: She is alert and oriented to person, place, and time. She exhibits normal muscle tone. Coordination normal.  Skin: Skin is warm and dry. No pallor.  Psychiatric: She has a normal mood and affect. Her behavior is normal.  Nursing note and vitals reviewed.      Vitals:   12/07/17 1016  BP: 118/74  Pulse: 89  Temp: 98.3 F (36.8 C)  SpO2: 97%    Assessment & Plan:    Left hip pain - suspect left hip pain is related to arthritis.  We will get x-ray of left hip for further evaluation.  Patient advised she can take over-the-counter Tylenol as needed for pain control also given prescription for BIOFREEZE topical rub to help pain.   Keep regularly scheduled appt for follow up

## 2017-12-07 NOTE — Patient Instructions (Signed)
You can take tylenol 650 mg 3 to 4 times per day as needed for pain in hip  Also can use topical BIOFREEZE rub for pain

## 2017-12-11 ENCOUNTER — Encounter: Payer: Self-pay | Admitting: Family Medicine

## 2017-12-11 ENCOUNTER — Ambulatory Visit (INDEPENDENT_AMBULATORY_CARE_PROVIDER_SITE_OTHER): Payer: Medicare Other | Admitting: Family Medicine

## 2017-12-11 VITALS — BP 122/82 | HR 90 | Temp 98.3°F | Resp 16 | Wt 118.0 lb

## 2017-12-11 DIAGNOSIS — M25552 Pain in left hip: Secondary | ICD-10-CM

## 2017-12-11 DIAGNOSIS — H10502 Unspecified blepharoconjunctivitis, left eye: Secondary | ICD-10-CM | POA: Diagnosis not present

## 2017-12-11 NOTE — Progress Notes (Signed)
  Tommi Rumps, MD Phone: [phone]  Carol Stark is a 84 y.o. female who presents today for follow-up.  CC: Left hip pain.  Left hip pain: Patient notes this has been going on chronically for some time.  She notes no injury.  Hurts when she walks.  Occasionally hurts when she lays down on it.  Is not taking any medications for it.  No physical therapy.  Recent x-ray revealed asymmetric axial left hip joint space loss and chronic bilateral acetabular degenerative spurring.  No acute changes noted.  Social History   Tobacco Use  Smoking Status Never Smoker  Smokeless Tobacco Never Used     ROS see history of present illness  Objective  Physical Exam Vitals:   12/11/17 1043  BP: 122/82  Pulse: 90  Resp: 16  Temp: 98.3 F (36.8 C)  SpO2: 97%    BP Readings from Last 3 Encounters:  12/11/17 122/82  12/07/17 118/74  11/29/17 128/82   Wt Readings from Last 3 Encounters:  12/11/17 118 lb (53.5 kg)  12/07/17 120 lb (54.4 kg)  11/29/17 119 lb 6 oz (54.1 kg)    Physical Exam  Constitutional: No distress.  Cardiovascular: Normal rate, regular rhythm and normal heart sounds.  Pulmonary/Chest: Effort normal and breath sounds normal.  Musculoskeletal: She exhibits no edema.  Tenderness over the left trochanteric bursa, no overlying skin changes, discomfort on internal range of motion left hip, slight decreased internal range of motion left hip, full range of motion externally on rotation left hip, no discomfort on palpation of right hip or on range of motion  Neurological: She is alert.  Skin: Skin is warm and dry. She is not diaphoretic.     Assessment/Plan: Please see individual problem list.  Left hip pain Chronic issue.  Suspect related osteoarthritis that we will check rheumatoid labs given comments and recent x-ray imaging.  Refer to orthopedics.  Tylenol for discomfort.   Orders Placed This Encounter  Procedures  . Rheumatoid Factor  . Cyclic  citrul peptide antibody, IgG  . Ambulatory referral to Orthopedic Surgery    Referral Priority:   Routine    Referral Type:   Surgical    Referral Reason:   Specialty Services Required    Requested Specialty:   Orthopedic Surgery    Number of Visits Requested:   1    No orders of the defined types were placed in this encounter.    Tommi Rumps, MD [place]

## 2017-12-11 NOTE — Patient Instructions (Signed)
Nice to see you. We will get some lab work today and send you to the orthopedist. You can try over-the-counter Tylenol for your discomfort.

## 2017-12-11 NOTE — Assessment & Plan Note (Signed)
Chronic issue.  Suspect related osteoarthritis that we will check rheumatoid labs given comments and recent x-ray imaging.  Refer to orthopedics.  Tylenol for discomfort.

## 2017-12-12 ENCOUNTER — Telehealth: Payer: Self-pay | Admitting: Family Medicine

## 2017-12-12 LAB — CYCLIC CITRUL PEPTIDE ANTIBODY, IGG

## 2017-12-12 LAB — RHEUMATOID FACTOR: Rhuematoid fact SerPl-aCnc: <14 IU/mL (ref <14)

## 2017-12-12 NOTE — Telephone Encounter (Signed)
Called Pt with lab and x-ray results. Pt would like a referral sent in to be seen by a Orthopedic specialist.

## 2017-12-12 NOTE — Telephone Encounter (Signed)
Copied from [place] [id number]. Topic: Quick Communication - See Telephone Encounter >> Dec 12, 2017  2:40 PM Bea Graff, NT wrote: CRM for notification. See Telephone encounter for: 12/12/17. Pt calling to get lab results and discuss x-ray.

## 2017-12-12 NOTE — Telephone Encounter (Signed)
Pt requesting x-ray results that [facility] ordered on 12/07/17. I left pt a detailed message with her lab result already.

## 2017-12-13 NOTE — Telephone Encounter (Signed)
She was referred to orthopedics when I saw her in the office.  Thanks.

## 2018-01-10 ENCOUNTER — Ambulatory Visit: Payer: Medicare Other | Admitting: Family Medicine

## 2018-01-11 ENCOUNTER — Ambulatory Visit (INDEPENDENT_AMBULATORY_CARE_PROVIDER_SITE_OTHER): Payer: Medicare Other | Admitting: Family Medicine

## 2018-01-11 ENCOUNTER — Ambulatory Visit: Payer: Medicare Other | Admitting: Family Medicine

## 2018-01-11 ENCOUNTER — Encounter: Payer: Self-pay | Admitting: Family Medicine

## 2018-01-11 ENCOUNTER — Telehealth: Payer: Self-pay | Admitting: Family Medicine

## 2018-01-11 VITALS — BP 140/80 | HR 106 | Temp 98.6°F | Ht 62.0 in | Wt 118.6 lb

## 2018-01-11 DIAGNOSIS — K219 Gastro-esophageal reflux disease without esophagitis: Secondary | ICD-10-CM

## 2018-01-11 DIAGNOSIS — R3129 Other microscopic hematuria: Secondary | ICD-10-CM | POA: Diagnosis not present

## 2018-01-11 DIAGNOSIS — M25552 Pain in left hip: Secondary | ICD-10-CM

## 2018-01-11 DIAGNOSIS — R801 Persistent proteinuria, unspecified: Secondary | ICD-10-CM | POA: Diagnosis not present

## 2018-01-11 DIAGNOSIS — Z23 Encounter for immunization: Secondary | ICD-10-CM | POA: Diagnosis not present

## 2018-01-11 DIAGNOSIS — H269 Unspecified cataract: Secondary | ICD-10-CM

## 2018-01-11 DIAGNOSIS — N952 Postmenopausal atrophic vaginitis: Secondary | ICD-10-CM

## 2018-01-11 MED ORDER — ESTRADIOL 0.1 MG/GM VA CREA: 1.0000 | TOPICAL_CREAM | VAGINAL | 3 refills | Status: DC

## 2018-01-11 NOTE — Progress Notes (Signed)
  Tommi Rumps, MD Phone: [phone]  Carol Stark is a 84 y.o. female who presents today for f/u.  CC: dysuria. Gerd, atrophic vaginitis, watery left eye  Patient previously seen for dysuria.  She received a course of antibiotics and her symptoms resolved.  Culture was negative.  Micro revealed blood.  Also had protein on urine.  She notes no gross hematuria.  No vaginal discharge.  No frequency.  No urgency.  No recurrence of dysuria.  No frothy urine.  GERD: Taking Nexium.  No abdominal pain, reflux, or blood in her stool.  Atrophic vaginitis: She has been on estradiol topically once weekly.  She continues to have some irritation.  No vaginal bleeding.  Overall doing well though would like to increase frequency.  She reports she had cataract surgery though.  Since then her left eye is been a little watery.  No discomfort.  No vision changes.  Social History   Tobacco Use  Smoking Status Never Smoker  Smokeless Tobacco Never Used     ROS see history of present illness  Objective  Physical Exam Vitals:   01/11/18 1048  BP: 140/80  Pulse: (!) 106  Temp: 98.6 F (37 C)  SpO2: 97%    BP Readings from Last 3 Encounters:  01/11/18 140/80  12/11/17 122/82  12/07/17 118/74   Wt Readings from Last 3 Encounters:  01/11/18 118 lb 9.6 oz (53.8 kg)  12/11/17 118 lb (53.5 kg)  12/07/17 120 lb (54.4 kg)    Physical Exam  Constitutional: No distress.  Eyes: Pupils are equal, round, and reactive to light. Conjunctivae are normal.  Mild watering of the left eye though no significant discharge of either eye  Cardiovascular: Normal rate, regular rhythm and normal heart sounds.  Pulmonary/Chest: Effort normal and breath sounds normal.  Musculoskeletal: She exhibits no edema.  Neurological: She is alert.  Skin: Skin is warm and dry. She is not diaphoretic.     Assessment/Plan: Please see individual problem list.  GERD (gastroesophageal reflux  disease) Adequately controlled.  Continue current regimen.  Cataracts, bilateral We will get her to follow-up with her ophthalmologist.  Vision checked and adequate.  Postmenopausal atrophic vaginitis We will provide a refill of her estradiol topical cream to use twice weekly.  Left hip pain Patient reports this resolved on its own.  Microscopic hematuria Noted on urine microscopy.  Patient is asymptomatic currently.  Could be related to her atrophic vaginitis or a urological cause.  Will refer to urology and nephrology.  Persistent proteinuria Referred to nephrology.   Orders Placed This Encounter  Procedures  . Flu vaccine HIGH DOSE PF (Fluzone High dose)  . Ambulatory referral to Urology    Referral Priority:   Routine    Referral Type:   Consultation    Referral Reason:   Specialty Services Required    Requested Specialty:   Urology    Number of Visits Requested:   1  . Ambulatory referral to Nephrology    Referral Priority:   Routine    Referral Type:   Consultation    Referral Reason:   Specialty Services Required    Requested Specialty:   Nephrology    Number of Visits Requested:   1    No orders of the defined types were placed in this encounter.    Tommi Rumps, MD [place]

## 2018-01-11 NOTE — Patient Instructions (Signed)
Nice to see you. We will have you see urology and nephrology. Monitor your hip pain and if it returns please let us know. We will get you to see your eye doctor. You can increase your Estrace cream to twice weekly.  If your symptoms persist please let us know.

## 2018-01-11 NOTE — Telephone Encounter (Signed)
Sent to pharmacy 

## 2018-01-11 NOTE — Telephone Encounter (Signed)
Pt states she needs a refill for estradiol (ESTRACE) 0.1 MG/GM vaginal cream pt is out of refills.   Pharmacy is [id number] [address], [place]  Call pt @ [phone]. Thank you!

## 2018-01-11 NOTE — Telephone Encounter (Signed)
Sent to PCP. Pt was seen for an OV today and requested to have this medication refilled.

## 2018-01-13 DIAGNOSIS — R801 Persistent proteinuria, unspecified: Secondary | ICD-10-CM | POA: Insufficient documentation

## 2018-01-13 DIAGNOSIS — R3129 Other microscopic hematuria: Secondary | ICD-10-CM | POA: Insufficient documentation

## 2018-01-13 NOTE — Assessment & Plan Note (Signed)
We will provide a refill of her estradiol topical cream to use twice weekly.

## 2018-01-13 NOTE — Assessment & Plan Note (Signed)
Noted on urine microscopy.  Patient is asymptomatic currently.  Could be related to her atrophic vaginitis or a urological cause.  Will refer to urology and nephrology.

## 2018-01-13 NOTE — Assessment & Plan Note (Signed)
Patient reports this resolved on its own.

## 2018-01-13 NOTE — Assessment & Plan Note (Signed)
-   Referred to nephrology.

## 2018-01-13 NOTE — Assessment & Plan Note (Signed)
We will get her to follow-up with her ophthalmologist.  Vision checked and adequate.

## 2018-01-13 NOTE — Assessment & Plan Note (Signed)
Adequately controlled.  Continue current regimen. 

## 2018-01-14 NOTE — Telephone Encounter (Signed)
Called pt and left a detailed VM that Rx has been sent into pt's pharmacy.

## 2018-01-23 DIAGNOSIS — N390 Urinary tract infection, site not specified: Secondary | ICD-10-CM | POA: Diagnosis not present

## 2018-01-24 ENCOUNTER — Ambulatory Visit: Payer: Self-pay | Admitting: Urology

## 2018-01-28 ENCOUNTER — Encounter: Payer: Self-pay | Admitting: Urology

## 2018-01-28 ENCOUNTER — Ambulatory Visit: Payer: Medicare Other | Admitting: Urology

## 2018-01-28 NOTE — Progress Notes (Deleted)
01/28/2018 5:23 AM   Carol Stark [birth year] [id number]  Referring provider: Leone Haven, MD [address] [place], [place] [address]  No chief complaint on file.   HPI: Patient is a 84 -year-old Caucasian female who presents today as a referral from Dr. Leone Haven for microscopic hematuria.    Patient was found to have microscopic hematuria on 11/29/2017 with 7-10 RBC's/hpf.  Urine culture grew skin flora.   She/He does/ does not have a prior history of recurrent urinary tract infections, nephrolithiasis, trauma to the genitourinary tract or malignancies of the genitourinary tract. ***  She/He does/does not have a family medical history of nephrolithiasis, malignancies of the genitourinary tract or hematuria. ***  Today, she/he are having/not having symptoms of frequent urination, urgency, dysuria, nocturia, incontinence, hesitancy, intermittency, straining to urinate or a weak urinary stream.  Patient denies any gross hematuria, dysuria or suprapubic/flank pain.  Patient denies any fevers, chills, nausea or vomiting.  Her/His UA today demonstrates ***.  He/She are/are not a smoker. She/He is a former smoker, with a*** ppd history.  Quit *** years ago.  They are/are not exposed to secondhand smoke.  They have/have not worked with Sports administrator, trichloroethylene, etc.   ***  He/She has HTN. ***   He/She has a high BMI.     PMH: Past Medical History:  Diagnosis Date  . Allergic rhinitis   . Arthritis   . GERD (gastroesophageal reflux disease)   . History of chicken pox   . UTI (lower urinary tract infection)     Surgical History: Past Surgical History:  Procedure Laterality Date  . CATARACT EXTRACTION W/PHACO Right 06/21/2017   Procedure: CATARACT EXTRACTION PHACO AND INTRAOCULAR LENS PLACEMENT (IOC);  Surgeon: Leandrew Koyanagi, MD;  Location: [facility] ORS;  Service: Ophthalmology;  Laterality: Right;  [place] 00:55.2 AP% 14.4 CDE  7.93 Fluid Pack Lot # [id number]  . CATARACT EXTRACTION W/PHACO Left 09/25/2017   Procedure: CATARACT EXTRACTION PHACO AND INTRAOCULAR LENS PLACEMENT (IOC);  Surgeon: Leandrew Koyanagi, MD;  Location: [facility] ORS;  Service: Ophthalmology;  Laterality: Left;  [place] 00:46 AP% 14.2 CDE 6.65 Fluid pack lot # [id number]  . COLONOSCOPY    . TONSILLECTOMY      Home Medications:  Allergies as of 01/28/2018      Reactions   Biaxin [clarithromycin] Other (See Comments)   "Was not good"      Medication List        Accurate as of 01/28/18  5:23 AM. Always use your most recent med list.          B COMPLEX PO Take 1 tablet by mouth daily.   CALCIUM 1000 + D PO Take 1 tablet by mouth daily.   calcium carbonate 500 MG chewable tablet Commonly known as:  TUMS - dosed in mg elemental calcium Chew 1 tablet by mouth 2 (two) times daily.   esomeprazole 40 MG capsule Commonly known as:  NEXIUM TAKE 1 CAPSULE (40 MG TOTAL) BY MOUTH DAILY BEFORE BREAKFAST.   estradiol 0.1 MG/GM vaginal cream Commonly known as:  ESTRACE Place 1 Applicatorful vaginally 2 (two) times a week.   fluticasone 50 MCG/ACT nasal spray Commonly known as:  FLONASE Place into the nose.   GENTEAL OP Place 1 drop into both eyes 2 (two) times daily.   Menthol (Topical Analgesic) 4 % Gel Apply 1 application topically 4 (four) times daily.   NASAL RELIEF 0.05 % nasal spray Generic drug:  oxymetazoline Place 1-2  sprays into both nostrils 2 (two) times daily as needed for congestion.   phenazopyridine 100 MG tablet Commonly known as:  PYRIDIUM Take 1 tablet (100 mg total) by mouth 3 (three) times daily as needed for pain.   polyethylene glycol powder powder Commonly known as:  GLYCOLAX/MIRALAX Take 17 g by mouth 2 (two) times daily.   sodium chloride 0.65 % Soln nasal spray Commonly known as:  OCEAN Place 1-2 sprays into both nostrils daily as needed for congestion.   vitamin C 500 MG tablet Commonly known as:   ASCORBIC ACID Take 500 mg by mouth daily.   VITAMIN D-3 PO Take 1 capsule by mouth daily.       Allergies:  Allergies  Allergen Reactions  . Biaxin [Clarithromycin] Other (See Comments)    "Was not good"    Family History: Family History  Problem Relation Age of Onset  . Arthritis Mother   . Heart disease Father     Social History:  reports that she has never smoked. She has never used smokeless tobacco. She reports that she drinks alcohol. She reports that she does not use drugs.  ROS:                                        Physical Exam: LMP  (LMP Unknown)   Constitutional:  Well nourished. Alert and oriented, No acute distress. HEENT: [place] AT, moist mucus membranes.  Trachea midline, no masses. Cardiovascular: No clubbing, cyanosis, or edema. Respiratory: Normal respiratory effort, no increased work of breathing. GI: Abdomen is soft, non tender, non distended, no abdominal masses. Liver and spleen not palpable.  No hernias appreciated.  Stool sample for occult testing is not indicated.   GU: No CVA tenderness.  No bladder fullness or masses.  Normal external genitalia, normal pubic hair distribution, no lesions.  Normal urethral meatus, no lesions, no prolapse, no discharge.   No urethral masses, tenderness and/or tenderness. No bladder fullness, tenderness or masses. Normal vagina mucosa, good estrogen effect, no discharge, no lesions, good pelvic support, no cystocele or rectocele noted.  No cervical motion tenderness.  Uterus is freely mobile and non-fixed.  No adnexal/parametria masses or tenderness noted.  Anus and perineum are without rashes or lesions.   ***  Skin: No rashes, bruises or suspicious lesions. Lymph: No cervical or inguinal adenopathy. Neurologic: Grossly intact, no focal deficits, moving all 4 extremities. Psychiatric: Normal mood and affect.  Laboratory Data: Lab Results  Component Value Date   WBC 9.1 12/27/2015   HGB 14.4  12/27/2015   HCT 42.5 12/27/2015   MCV 88.9 12/27/2015   PLT 263.0 12/27/2015    Lab Results  Component Value Date   CREATININE 0.74 07/20/2017    No results found for: PSA  No results found for: TESTOSTERONE  No results found for: HGBA1C  Lab Results  Component Value Date   TSH 1.48 10/22/2015       Component Value Date/Time   CHOL 231 (H) 07/20/2017 0818   HDL 57.50 07/20/2017 0818   CHOLHDL 4 07/20/2017 0818   VLDL 14.4 07/20/2017 0818   LDLCALC 159 (H) 07/20/2017 0818    Lab Results  Component Value Date   AST 14 07/20/2017   Lab Results  Component Value Date   ALT 11 07/20/2017   No components found for: ALKALINEPHOPHATASE No components found for: BILIRUBINTOTAL  No results found for:  ESTRADIOL   Urinalysis ***  Pertinent Imaging: ***  Assessment & Plan:  ***  1. Microscopic hematuria Explained to the patient that there are a number of causes that can be associated with blood in the urine, such as stones, UTI's, damage to the urinary tract and/or cancer. At this time, I felt that the patient warranted further urologic evaluation.   The AUA guidelines state that a CT urogram is the preferred imaging study to evaluate hematuria. I explained to the patient that a contrast material will be injected into a vein and that in rare instances, an allergic reaction can result and may even life threatening   The patient denies any allergies to contrast***, iodine and/or seafood*** and is not taking metformin.*** Her reproductive status is hysterectomy, postmenopausal, tubal ligation are unknown at this time.  We will obtain a serum pregnancy test today. ***  Explained to the patient that the imaging studies performed on ***are not the recommended studies by the AUA for the work-up of blood in the urine.    The imaging studies that were performed lack the detail of excluding some urological tumors. The recommended study is a CT urogram.  This study does require the  use of contrast material.    At this time, he/she may choose to forego the appropriate study with the understanding that they are risking a missed diagnosis of a urological cancer and proceed with in office cystoscopy Explained to the patient that almost half of all renal cell carcinoma is found incidentally and not to wait on the classic triad of flank pain, palpable abdominal mass and gross hematuria that is generally only seen in more advanced disease.  There are currently 64,000 new cases in the [place] and 14,000 deaths in the [place] every year.   They may also choose to undergo the appropriate study (CTU) or choose to undergo a cystoscopy with bilateral RTG's to complete the hematuria work up. ***  On occasion, we may need to resort to a non-contrast study such as a renal ultrasound or a non-contrast CT if the patient has a contrast allergy or renal insufficiency.  In the latter case,  I told him/her *** that an upper tract study without contrast will lack the detail of excluding some urologic tumors.  Because of this, he/she would need to undergo cystoscopy with bilateral retrogrades in the OR to complete the hematuria workup in addition to the imaging studies. ***  Following the imaging study,  I've recommended a cystoscopy. I described how this is performed, typically in an office setting with a flexible cystoscope. We described the risks, benefits, and possible side effects, the most common of which is a minor amount of blood in the urine and/or burning which usually resolves in 24 to 48 hours.  *** There is a 20% risk with gross hematuria and a 2% to 5% risk with microscopic hematuria of missing a bladder cancer.  Our goal is to identify the cancer in its early stage so as to give you the greater change of survival and a cure.   *** The patient had the opportunity to ask questions which were answered. Based upon this discussion, the patient is willing to proceed. Therefore, I've ordered: a CT Urogram and  cystoscopy. ***  - The patient will return following all of the above for discussion of the results.   - UA ***  - Urine culture ***  - BUN + creatinine  ***    No follow-ups on file.  These notes generated with voice recognition software. I apologize for typographical errors.  Zara Council, PA-C  [facility] [address] [place]  [place], [place] [address] [phone]

## 2018-01-29 DIAGNOSIS — H01003 Unspecified blepharitis right eye, unspecified eyelid: Secondary | ICD-10-CM | POA: Diagnosis not present

## 2018-02-21 ENCOUNTER — Encounter: Payer: Self-pay | Admitting: Family Medicine

## 2018-02-21 ENCOUNTER — Ambulatory Visit (INDEPENDENT_AMBULATORY_CARE_PROVIDER_SITE_OTHER): Payer: Medicare Other | Admitting: Family Medicine

## 2018-02-21 VITALS — BP 148/92 | HR 97 | Temp 97.9°F | Ht 64.0 in | Wt 118.6 lb

## 2018-02-21 DIAGNOSIS — R110 Nausea: Secondary | ICD-10-CM | POA: Diagnosis not present

## 2018-02-21 DIAGNOSIS — K219 Gastro-esophageal reflux disease without esophagitis: Secondary | ICD-10-CM | POA: Diagnosis not present

## 2018-02-21 MED ORDER — ONDANSETRON 4 MG PO TBDP: 4.0000 mg | ORAL_TABLET | Freq: Three times a day (TID) | ORAL | 1 refills | Status: DC | PRN

## 2018-02-21 NOTE — Patient Instructions (Signed)

## 2018-02-21 NOTE — Progress Notes (Signed)
Subjective:    Patient ID: Carol Stark, female    DOB: 10-22-1931, 84 y.o.   MRN: [id number]  HPI  Patient presents to clinic c/o nausea for 2 days. States she has still been eating, had tea and english muffin for breakfast. Denies vomiting or diarrhea. No fever or chills.  Denies abdominal pain, states she "just feels punky".  She takes Nexium every day, and has used Tums occasionally as needed.  States the Tums do seem to help calm down nauseousness when it occurs.  Patient Active Problem List   Diagnosis Date Noted  . Microscopic hematuria 01/13/2018  . Persistent proteinuria 01/13/2018  . Abnormal appearance of cervix 07/10/2017  . Cataracts, bilateral 07/10/2017  . Passing gas 07/10/2017  . Primary osteoarthritis of left hip 06/17/2017  . Left hip pain 04/11/2017  . BPPV (benign paroxysmal positional vertigo) 06/29/2016  . Allergic rhinitis 06/05/2016  . Postmenopausal atrophic vaginitis 06/05/2016  . History of hyperlipidemia 02/23/2016  . GERD (gastroesophageal reflux disease) 11/08/2015  . Basal cell carcinoma 10/25/2015  . Constipation 10/22/2015   Social History   Tobacco Use  . Smoking status: Never Smoker  . Smokeless tobacco: Never Used  Substance Use Topics  . Alcohol use: Yes    Alcohol/week: 0.0 standard drinks   Review of Systems  Constitutional: Negative for chills, fatigue and fever.  HENT: Negative for congestion, ear pain, sinus pain and sore throat.   Eyes: Negative.   Respiratory: Negative for cough, shortness of breath and wheezing.   Cardiovascular: Negative for chest pain, palpitations and leg swelling.  Gastrointestinal: +nausea. Negative for abdominal pain, diarrhea, vomiting.  Genitourinary: Negative for dysuria, frequency and urgency.  Musculoskeletal: Negative for arthralgias and myalgias.  Skin: Negative for color change, pallor and rash.  Neurological: Negative for syncope, light-headedness and headaches.    Psychiatric/Behavioral: The patient is not nervous/anxious.       Objective:   Physical Exam  Constitutional: She is oriented to person, place, and time. No distress.  HENT:  Head: Normocephalic and atraumatic.  Eyes: Conjunctivae and EOM are normal. No scleral icterus.  Neck: Neck supple. No JVD present. No tracheal deviation present.  Cardiovascular: Normal rate, regular rhythm and normal heart sounds.  Pulmonary/Chest: Effort normal and breath sounds normal. No respiratory distress. She has no wheezes. She has no rales.  Abdominal: Soft. Bowel sounds are normal. She exhibits no distension. There is no tenderness. There is no rebound and no guarding.  Neurological: She is alert and oriented to person, place, and time.  Skin: Skin is warm and dry. No rash noted. She is not diaphoretic. No pallor.  Psychiatric: She has a normal mood and affect. Her behavior is normal.  Nursing note and vitals reviewed.     Vitals:   02/21/18 1449  BP: (!) 148/92  Pulse: 97  Temp: 97.9 F (36.6 C)  SpO2: 97%   Assessment & Plan:   Nausea- physical exam benign, vital signs stable, patient appears nontoxic.  We will do CBC and CMP in lab today.  Patient advised to follow a bland diet with clear liquids over the next couple of days and then slowly advance diet as tolerated.  I suspect her nauseousness could be related to acid reflux.  Patient will use Zofran ODT as needed, advised to try taking half hour before eating to see if this consult stomach enough where it allows her to eat a regular meal.  GERD- patient will continue Nexium 40 mg once daily, advised  she may continue to use Tums on occasion if needed.  Keep regularly scheduled follow-up with PCP as planned.  Return to clinic sooner if any issues arise.

## 2018-02-22 ENCOUNTER — Encounter: Payer: Self-pay | Admitting: Family Medicine

## 2018-02-22 LAB — CBC
HEMATOCRIT: 42.1 % (ref 36.0–46.0)
Hemoglobin: 14 g/dL (ref 12.0–15.0)
MCHC: 33.2 g/dL (ref 30.0–36.0)
MCV: 90.3 fl (ref 78.0–100.0)
Platelets: 249 10*3/uL (ref 150.0–400.0)
RBC: 4.67 Mil/uL (ref 3.87–5.11)
RDW: 12.9 % (ref 11.5–15.5)
WBC: 6.8 10*3/uL (ref 4.0–10.5)

## 2018-02-22 LAB — COMPREHENSIVE METABOLIC PANEL
ALBUMIN: 4.2 g/dL (ref 3.5–5.2)
ALT: 8 U/L (ref 0–35)
AST: 12 U/L (ref 0–37)
Alkaline Phosphatase: 66 U/L (ref 39–117)
BUN: 18 mg/dL (ref 6–23)
CALCIUM: 9.2 mg/dL (ref 8.4–10.5)
CHLORIDE: 104 meq/L (ref 96–112)
CO2: 29 mEq/L (ref 19–32)
Creatinine, Ser: 0.78 mg/dL (ref 0.40–1.20)
GFR: 74.79 mL/min (ref >60.00)
Glucose, Bld: 85 mg/dL (ref 70–99)
POTASSIUM: 4.3 meq/L (ref 3.5–5.1)
SODIUM: 139 meq/L (ref 135–145)
Total Bilirubin: 0.4 mg/dL (ref 0.2–1.2)
Total Protein: 7.2 g/dL (ref 6.0–8.3)

## 2018-02-25 DIAGNOSIS — R110 Nausea: Secondary | ICD-10-CM | POA: Diagnosis not present

## 2018-03-25 ENCOUNTER — Encounter: Payer: Self-pay | Admitting: Family Medicine

## 2018-03-25 ENCOUNTER — Ambulatory Visit (INDEPENDENT_AMBULATORY_CARE_PROVIDER_SITE_OTHER): Payer: Medicare Other | Admitting: Family Medicine

## 2018-03-25 VITALS — BP 98/62 | HR 92 | Temp 98.0°F | Ht 64.0 in | Wt 117.6 lb

## 2018-03-25 DIAGNOSIS — R110 Nausea: Secondary | ICD-10-CM | POA: Diagnosis not present

## 2018-03-25 DIAGNOSIS — R1013 Epigastric pain: Secondary | ICD-10-CM | POA: Diagnosis not present

## 2018-03-25 DIAGNOSIS — R3129 Other microscopic hematuria: Secondary | ICD-10-CM | POA: Diagnosis not present

## 2018-03-25 DIAGNOSIS — R0789 Other chest pain: Secondary | ICD-10-CM

## 2018-03-25 NOTE — Assessment & Plan Note (Signed)
Discussed the need to see urology.  Referral was placed again.

## 2018-03-25 NOTE — Progress Notes (Signed)
Tommi Rumps, MD Phone: [phone]  Carol Stark is a 84 y.o. female who presents today for follow-up.  CC: Nausea, GERD, atypical chest discomfort, microscopic hematuria  Patient notes over the last several days she has had nausea.  She notes occasional epigastric discomfort.  She notes that over the last couple of weeks she has had some discomfort going into her chest in the area of her esophagus that comes on quickly and goes away just as quickly.  It is a dull discomfort.  Typically occurs when she is sitting there or laying down.  No dyspnea or diaphoresis.  No fevers.  No exertional component.  No vertigo.  No vomiting or diarrhea.  She notes no burning discomfort.  She does note her symptoms improve with Tums which she takes prior to going down for dinner.  She takes her Nexium prior to bedtime.   Patient was previously referred to urology for microscopic hematuria following resolution of UTI.  It appears that she may have canceled the appointment previously.  She notes no gross hematuria.  She is willing to see urology.  Social History   Tobacco Use  Smoking Status Never Smoker  Smokeless Tobacco Never Used     ROS see history of present illness  Objective  Physical Exam Vitals:   03/25/18 1437  BP: 98/62  Pulse: 92  Temp: 98 F (36.7 C)  SpO2: 98%    BP Readings from Last 3 Encounters:  03/25/18 98/62  02/21/18 (!) 148/92  01/11/18 140/80   Wt Readings from Last 3 Encounters:  03/25/18 117 lb 9.6 oz (53.3 kg)  02/21/18 118 lb 9.6 oz (53.8 kg)  01/11/18 118 lb 9.6 oz (53.8 kg)    Physical Exam Constitutional:      General: She is not in acute distress.    Appearance: She is not diaphoretic.  Cardiovascular:     Rate and Rhythm: Normal rate and regular rhythm.     Heart sounds: Normal heart sounds.  Pulmonary:     Effort: Pulmonary effort is normal.     Breath sounds: Normal breath sounds.  Abdominal:     General: Bowel sounds are normal.  There is no distension.     Palpations: Abdomen is soft. There is no mass.     Tenderness: There is abdominal tenderness (Mild right upper quadrant and right-sided abdominal tenderness). There is no guarding.  Skin:    General: Skin is warm and dry.  Neurological:     Mental Status: She is alert.    EKG: Normal sinus rhythm, rate 73, no ischemic changes, apparent U wave  Assessment/Plan: Please see individual problem list.  Microscopic hematuria Discussed the need to see urology.  Referral was placed again.  Nausea Patient with intermittent nausea.  Previously seen for this by FNP.  Seems to have had recurrent issues.  Her description of discomfort in her epigastric region and lower chest is most likely related to indigestion.  This is not an exertional issue and has no typical features for cardiac cause.  EKG was completed today and is reassuring.  There is a U wave noted which could indicate hypokalemia.  We will check this as part of our lab work.  We will check lab work as outlined below given her slight tenderness on exam.  We will have her take her Nexium 30 minutes prior to dinner to see if that would be beneficial.  She is given return precautions.   Orders Placed This Encounter  Procedures  .  CBC  . Comp Met (CMET)  . Lipase  . Ambulatory referral to Urology    Referral Priority:   Routine    Referral Type:   Consultation    Referral Reason:   Specialty Services Required    Requested Specialty:   Urology    Number of Visits Requested:   1  . EKG 12-Lead    No orders of the defined types were placed in this encounter.    Tommi Rumps, MD [place]

## 2018-03-25 NOTE — Patient Instructions (Signed)
Nice to see you. We will get lab work today and contact you with the results. Please change your Nexium to 30 minutes prior to dinner. We will get you to see urology for the blood seen on microscopic view of your urine previously. If you develop persistent chest pain, shortness of breath, abdominal pain, or or fevers please be evaluated immediately.

## 2018-03-25 NOTE — Assessment & Plan Note (Addendum)
Patient with intermittent nausea.  Previously seen for this by FNP.  Seems to have had recurrent issues.  Her description of discomfort in her epigastric region and lower chest is most likely related to indigestion.  This is not an exertional issue and has no typical features for cardiac cause.  EKG was completed today and is reassuring.  There is a U wave noted which could indicate hypokalemia.  We will check this as part of our lab work.  We will check lab work as outlined below given her slight tenderness on exam.  We will have her take her Nexium 30 minutes prior to dinner to see if that would be beneficial.  She is given return precautions.

## 2018-03-26 LAB — COMPREHENSIVE METABOLIC PANEL
ALBUMIN: 4.2 g/dL (ref 3.5–5.2)
ALK PHOS: 64 U/L (ref 39–117)
ALT: 12 U/L (ref 0–35)
AST: 14 U/L (ref 0–37)
BUN: 17 mg/dL (ref 6–23)
CALCIUM: 9.6 mg/dL (ref 8.4–10.5)
CO2: 28 mEq/L (ref 19–32)
CREATININE: 0.72 mg/dL (ref 0.40–1.20)
Chloride: 102 mEq/L (ref 96–112)
GFR: 82.01 mL/min (ref >60.00)
Glucose, Bld: 105 mg/dL — ABNORMAL HIGH (ref 70–99)
Potassium: 3.7 mEq/L (ref 3.5–5.1)
SODIUM: 137 meq/L (ref 135–145)
TOTAL PROTEIN: 7.3 g/dL (ref 6.0–8.3)
Total Bilirubin: 0.4 mg/dL (ref 0.2–1.2)

## 2018-03-26 LAB — CBC
HEMATOCRIT: 43 % (ref 36.0–46.0)
Hemoglobin: 14.2 g/dL (ref 12.0–15.0)
MCHC: 33 g/dL (ref 30.0–36.0)
MCV: 90.2 fl (ref 78.0–100.0)
PLATELETS: 250 10*3/uL (ref 150.0–400.0)
RBC: 4.77 Mil/uL (ref 3.87–5.11)
RDW: 13 % (ref 11.5–15.5)
WBC: 7.8 10*3/uL (ref 4.0–10.5)

## 2018-03-26 LAB — LIPASE: LIPASE: 42 U/L (ref 11.0–59.0)

## 2018-04-22 DIAGNOSIS — H353131 Nonexudative age-related macular degeneration, bilateral, early dry stage: Secondary | ICD-10-CM | POA: Diagnosis not present

## 2018-04-22 DIAGNOSIS — D3131 Benign neoplasm of right choroid: Secondary | ICD-10-CM | POA: Diagnosis not present

## 2018-04-23 ENCOUNTER — Ambulatory Visit (INDEPENDENT_AMBULATORY_CARE_PROVIDER_SITE_OTHER): Payer: Medicare Other | Admitting: Family Medicine

## 2018-04-23 ENCOUNTER — Encounter: Payer: Self-pay | Admitting: Family Medicine

## 2018-04-23 ENCOUNTER — Telehealth: Payer: Self-pay | Admitting: Family Medicine

## 2018-04-23 ENCOUNTER — Ambulatory Visit: Payer: Medicare Other | Admitting: Family Medicine

## 2018-04-23 VITALS — BP 108/62 | HR 86 | Temp 98.1°F | Ht 64.0 in | Wt 117.0 lb

## 2018-04-23 DIAGNOSIS — K219 Gastro-esophageal reflux disease without esophagitis: Secondary | ICD-10-CM

## 2018-04-23 DIAGNOSIS — R3129 Other microscopic hematuria: Secondary | ICD-10-CM | POA: Diagnosis not present

## 2018-04-23 DIAGNOSIS — R110 Nausea: Secondary | ICD-10-CM | POA: Diagnosis not present

## 2018-04-23 DIAGNOSIS — R801 Persistent proteinuria, unspecified: Secondary | ICD-10-CM

## 2018-04-23 MED ORDER — TETANUS-DIPHTH-ACELL PERTUSSIS 5-2.5-18.5 LF-MCG/0.5 IM SUSP
0.5000 mL | Freq: Once | INTRAMUSCULAR | 0 refills | Status: AC
Start: 2018-04-23 — End: 2018-04-23

## 2018-04-23 NOTE — Progress Notes (Signed)
Tommi Rumps, MD Phone: [phone]  Sarahanne Novakowski Carol Stark is a 85 y.o. female who presents today for follow-up.  CC: GERD, nausea  GERD: Patient reports she continued to take her Nexium at night.  She notes no significant reflux it does report some nausea.  No abdominal pain.  No blood in her stool.  No dysphagia.  No vomiting or diarrhea.  No chest pain.  She notes the nausea occurs in the afternoon prior to going to dinner.  She reports she saw a specialist recently though she does not remember the name.  I have a referral coordinator check on this to see if she saw nephrology.  Social History   Tobacco Use  Smoking Status Never Smoker  Smokeless Tobacco Never Used     ROS see history of present illness  Objective  Physical Exam Vitals:   04/23/18 1357  BP: 108/62  Pulse: 86  Temp: 98.1 F (36.7 C)  SpO2: 98%    BP Readings from Last 3 Encounters:  04/23/18 108/62  03/25/18 98/62  02/21/18 (!) 148/92   Wt Readings from Last 3 Encounters:  04/23/18 117 lb (53.1 kg)  03/25/18 117 lb 9.6 oz (53.3 kg)  02/21/18 118 lb 9.6 oz (53.8 kg)    Physical Exam Constitutional:      General: She is not in acute distress.    Appearance: She is not diaphoretic.  Cardiovascular:     Rate and Rhythm: Normal rate and regular rhythm.     Heart sounds: Normal heart sounds.  Pulmonary:     Effort: Pulmonary effort is normal.     Breath sounds: Normal breath sounds.  Abdominal:     General: Bowel sounds are normal. There is no distension.     Palpations: Abdomen is soft.     Tenderness: There is no abdominal tenderness. There is no guarding or rebound.  Skin:    General: Skin is warm and dry.  Neurological:     Mental Status: She is alert.      Assessment/Plan: Please see individual problem list.  GERD (gastroesophageal reflux disease) I suspect her symptoms are related to GERD or gastritis.  Prior lab work unremarkable.  She will change her Nexium to 30 minutes  prior to breakfast.  Will refer to GI for evaluation given persistence.  Microscopic hematuria Discussed the reason for her seeing urology.  She will keep her appointment with them.  Persistent proteinuria Undetermined if she has seen nephrology.  It sounds as though she may have.  We will have our referral coordinator check into this.   Orders Placed This Encounter  Procedures  . Ambulatory referral to Gastroenterology    Referral Priority:   Routine    Referral Type:   Consultation    Referral Reason:   Specialty Services Required    Number of Visits Requested:   1  . Ambulatory referral to Nephrology    Referral Priority:   Routine    Referral Type:   Consultation    Referral Reason:   Specialty Services Required    Requested Specialty:   Nephrology    Number of Visits Requested:   1    Meds ordered this encounter  Medications  . Tdap (BOOSTRIX) 5-2.5-18.5 LF-MCG/0.5 injection    Sig: Inject 0.5 mLs into the muscle once for 1 dose.    Dispense:  0.5 mL    Refill:  0  She will get this at the pharmacy.    Tommi Rumps, MD  Primary  [place]

## 2018-04-23 NOTE — Patient Instructions (Signed)
Nice to see you. Please change your Nexium to 30 minutes prior to breakfast. We will have you see GI. Please keep your appointment with urology.

## 2018-04-23 NOTE — Telephone Encounter (Signed)
Pt would like to know if she can have a refill for esomeprazole (NEXIUM) 40 MG capsule pt states she has about 1 week of pills left. Please advise Thank you!  Pharmacy is [id number] - [place], [place]

## 2018-04-23 NOTE — Assessment & Plan Note (Signed)
Discussed the reason for her seeing urology.  She will keep her appointment with them.

## 2018-04-23 NOTE — Assessment & Plan Note (Signed)
Undetermined if she has seen nephrology.  It sounds as though she may have.  We will have our referral coordinator check into this.

## 2018-04-23 NOTE — Telephone Encounter (Signed)
Pt was seen today   Last refilled 10/09/2017 disp 90 with 3 refills   Too soon for a refill

## 2018-04-23 NOTE — Assessment & Plan Note (Signed)
I suspect her symptoms are related to GERD or gastritis.  Prior lab work unremarkable.  She will change her Nexium to 30 minutes prior to breakfast.  Will refer to GI for evaluation given persistence.

## 2018-04-24 NOTE — Telephone Encounter (Signed)
Ok Thank you I notified pt of that. Pt stated ok.

## 2018-04-30 ENCOUNTER — Ambulatory Visit (INDEPENDENT_AMBULATORY_CARE_PROVIDER_SITE_OTHER): Payer: Medicare Other | Admitting: Gastroenterology

## 2018-04-30 ENCOUNTER — Telehealth: Payer: Self-pay | Admitting: Family Medicine

## 2018-04-30 ENCOUNTER — Encounter: Payer: Self-pay | Admitting: Gastroenterology

## 2018-04-30 VITALS — BP 129/79 | HR 94 | Ht 64.0 in | Wt 113.8 lb

## 2018-04-30 DIAGNOSIS — R1084 Generalized abdominal pain: Secondary | ICD-10-CM

## 2018-04-30 DIAGNOSIS — R110 Nausea: Secondary | ICD-10-CM | POA: Diagnosis not present

## 2018-04-30 NOTE — Progress Notes (Signed)
Pt to call back with place, phone number of the previous provider she saw in [place], [place], so we may try to get her records. She is not sure that she does.

## 2018-04-30 NOTE — Progress Notes (Signed)
Carol Stark [address]  [place], [place] [address]  Main: [phone]  Fax: [phone]   Gastroenterology Consultation  Referring Provider:     Leone Haven, MD Primary Care Physician:  Leone Haven, MD Reason for Consultation:     Nausea        HPI:    Chief Complaint  Patient presents with  . New Patient (Initial Visit)    GERD, chronic nausea; referral from Dr. Mickel Fuchs A Popson is a 85 y.o. y/o female referred for consultation & management  by Dr. Caryl Bis, Angela Adam, MD.  Patient reports chronic history of nausea.  States this has been an ongoing issue for her.  Does not remember prior endoscopies.  No dysphagia.  No weight loss.  No vomiting.  No altered bowel habits.  States she has been on Nexium for a year and a half which helped somewhat but continues to have daily nausea.  Has low appetite as well.  Is somewhat of a poor historian.  Reports having a colonoscopy about 2 years ago in [place].  Does not recall the name of the provider or the facility.  States will check her records and call us back so we can send record release to them.  Past Medical History:  Diagnosis Date  . Allergic rhinitis   . Arthritis   . GERD (gastroesophageal reflux disease)   . History of chicken pox   . UTI (lower urinary tract infection)     Past Surgical History:  Procedure Laterality Date  . CATARACT EXTRACTION W/PHACO Right 06/21/2017   Procedure: CATARACT EXTRACTION PHACO AND INTRAOCULAR LENS PLACEMENT (IOC);  Surgeon: Leandrew Koyanagi, MD;  Location: [facility];  Service: Ophthalmology;  Laterality: Right;  [place] 00:55.2 AP% 14.4 CDE 7.93 Fluid Pack Lot # [id number]  . CATARACT EXTRACTION W/PHACO Left 09/25/2017   Procedure: CATARACT EXTRACTION PHACO AND INTRAOCULAR LENS PLACEMENT (IOC);  Surgeon: Leandrew Koyanagi, MD;  Location: [facility];  Service: Ophthalmology;  Laterality: Left;  [place] 00:46 AP% 14.2 CDE 6.65 Fluid  pack lot # [id number]  . COLONOSCOPY    . TONSILLECTOMY      Prior to Admission medications   Medication Sig Start Date End Date Taking? Authorizing Provider  Ascorbic Acid (VITAMIN C) 500 MG CAPS Take by mouth.   Yes [provider]  esomeprazole (NEXIUM) 40 MG capsule TAKE 1 CAPSULE (40 MG TOTAL) BY MOUTH DAILY BEFORE BREAKFAST. 10/09/17  Yes Leone Haven, MD    Family History  Problem Relation Age of Onset  . Arthritis Mother   . Heart disease Father      Social History   Tobacco Use  . Smoking status: Never Smoker  . Smokeless tobacco: Never Used  Substance Use Topics  . Alcohol use: Yes    Alcohol/week: 0.0 standard drinks    Comment: rarely  . Drug use: No    Allergies as of 04/30/2018 - Review Complete 04/30/2018  Allergen Reaction Noted  . Biaxin [clarithromycin] Other (See Comments) 10/22/2015    Review of Systems:    All systems reviewed and negative except where noted in HPI.   Physical Exam:  BP 129/79   Pulse 94   Ht 5\' 4"  (1.626 m)   Wt 113 lb 12.8 oz (51.6 kg)   LMP  (LMP Unknown)   BMI 19.53 kg/m  No LMP recorded (lmp unknown). Patient is postmenopausal. Psych:  Alert and cooperative. Normal mood and affect. General:  Alert,  Well-developed, well-nourished, pleasant and cooperative in NAD Head:  Normocephalic and atraumatic. Eyes:  Sclera clear, no icterus.   Conjunctiva pink. Ears:  Normal auditory acuity. Nose:  No deformity, discharge, or lesions. Mouth:  No deformity or lesions,oropharynx pink & moist. Neck:  Supple; no masses or thyromegaly. Abdomen:  Normal bowel sounds.  No bruits.  Soft, non-tender and non-distended without masses, hepatosplenomegaly or hernias noted.  No guarding or rebound tenderness.    Msk:  Symmetrical without gross deformities. Good, equal movement & strength bilaterally. Pulses:  Normal pulses noted. Extremities:  No clubbing or edema.  No cyanosis. Neurologic:  Alert and oriented x3;  grossly normal  neurologically. Skin:  Intact without significant lesions or rashes. No jaundice. Lymph Nodes:  No significant cervical adenopathy. Psych:  Alert and cooperative. Normal mood and affect.   Labs: CBC    Component Value Date/Time   WBC 7.8 03/25/2018 1508   RBC 4.77 03/25/2018 1508   HGB 14.2 03/25/2018 1508   HCT 43.0 03/25/2018 1508   PLT 250.0 03/25/2018 1508   MCV 90.2 03/25/2018 1508   MCH 29.8 10/22/2015 1600   MCHC 33.0 03/25/2018 1508   RDW 13.0 03/25/2018 1508   LYMPHSABS 2,184 10/22/2015 1600   MONOABS 756 10/22/2015 1600   EOSABS 84 10/22/2015 1600   BASOSABS 0 10/22/2015 1600   CMP     Component Value Date/Time   NA 137 03/25/2018 1508   K 3.7 03/25/2018 1508   CL 102 03/25/2018 1508   CO2 28 03/25/2018 1508   GLUCOSE 105 (H) 03/25/2018 1508   BUN 17 03/25/2018 1508   CREATININE 0.72 03/25/2018 1508   CREATININE 0.77 10/22/2015 1600   CALCIUM 9.6 03/25/2018 1508   PROT 7.3 03/25/2018 1508   ALBUMIN 4.2 03/25/2018 1508   AST 14 03/25/2018 1508   ALT 12 03/25/2018 1508   ALKPHOS 64 03/25/2018 1508   BILITOT 0.4 03/25/2018 1508    Imaging Studies: No results found.  Assessment and Plan:   Carol Stark is a 85 y.o. y/o female has been referred for nausea  Patient is not really on any medications besides vitamin C and Nexium Etiology of her nausea is unknown but seems chronic Denies postprandial abdominal pain therefore low suspicion for ischemia We discussed options of endoscopy for further evaluation and obtain biopsies for H. pylori Peptic ulcer disease or masses.  Alternative of conservative management with imaging was also discussed.  Patient would like to proceed with upper endoscopy.  She will call us once she looks at her records at home to let us know when and where her last colonoscopy was so we can obtain records as well.  I have discussed alternative options, risks & benefits,  which include, but are not limited to, bleeding,  infection, perforation,respiratory complication & drug reaction.  The patient agrees with this plan & written consent will be obtained.     Dr Carol Stark  Speech recognition software was used to dictate the above note.

## 2018-05-08 ENCOUNTER — Ambulatory Visit: Payer: Medicare Other | Admitting: Urology

## 2018-05-09 ENCOUNTER — Ambulatory Visit: Payer: Medicare Other | Admitting: Urology

## 2018-05-21 ENCOUNTER — Ambulatory Visit: Payer: Medicare Other | Admitting: Family Medicine

## 2018-05-21 ENCOUNTER — Telehealth: Payer: Self-pay

## 2018-05-21 NOTE — Telephone Encounter (Signed)
Copied from [place] [phone]. Topic: Appointment Scheduling - Scheduling Inquiry for Clinic >> May 21, 2018  1:26 PM Sheran Luz wrote: Reason for CRM: Patient called to state that she was unable to make appointment today at 1:00 due to her car breaking down. Patient states she would like to reschedule this appointment but does not know when she will be able to have reliable transportation. Patient inquired about a no show fee and states she will call back to reschedule.

## 2018-05-21 NOTE — Telephone Encounter (Addendum)
The patient was not on my schedule for this.  I will forward to Lauren to make this decision as the patient was scheduled with her.

## 2018-05-21 NOTE — Telephone Encounter (Signed)
Sent to PCP to advise if there is a fee or no fee for patient no show due to care issues.   Thanks

## 2018-05-22 ENCOUNTER — Encounter: Admission: RE | Payer: Self-pay | Source: Home / Self Care

## 2018-05-22 ENCOUNTER — Ambulatory Visit: Payer: Medicare Other | Admitting: Family Medicine

## 2018-05-22 ENCOUNTER — Ambulatory Visit: Admission: RE | Admit: 2018-05-22 | Payer: Medicare Other | Source: Home / Self Care | Admitting: Gastroenterology

## 2018-05-22 SURGERY — ESOPHAGOGASTRODUODENOSCOPY (EGD) WITH PROPOFOL
Anesthesia: General

## 2018-05-22 NOTE — Telephone Encounter (Signed)
Left  Message for Carol Stark at [facility] [place] to call office.,
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
## 2018-05-22 NOTE — Telephone Encounter (Addendum)
I spoke with patient yesterday who did not even realize she had an appt but then said she did not have transportation so she asked me to reschedule it for today, which I did. She guaranteed me she would let us know if she could get a cab and if not she would call back. She seemed very unsure and confused. She called back this morning and spoke with Jessi and advised Jessi she did not even know she had an appt this morning. Jessi called to office and spoke with Juliann Pulse who said she would call the place she lives at to check on her.

## 2018-05-22 NOTE — Telephone Encounter (Signed)
I left the patient a voicemail on her cell phone to give me a call back. I was unable to get through on her home phone . The patient has an appointment with Dr. Caryl Bis on 2.21.20. Does she need the appointment before that time.

## 2018-05-22 NOTE — Telephone Encounter (Signed)
Noted.  I will plan on seeing her in the office as scheduled.
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
## 2018-05-22 NOTE — Telephone Encounter (Signed)
She is on my schedule again today. If she misses today then yes for TODAY a no-show fee  Yesterday her car broke down, so no fee for yesterday; understandable reason why she missed  Not sure why she was rescheduled so quickly -- if her car is broken down she can wait until fixed to make appointment. There is a note on her appointment today that she is not sure if she will make it. How about she cancel today and call when she is sure?

## 2018-05-23 ENCOUNTER — Ambulatory Visit: Payer: Medicare Other | Admitting: Family Medicine

## 2018-05-27 ENCOUNTER — Ambulatory Visit (INDEPENDENT_AMBULATORY_CARE_PROVIDER_SITE_OTHER): Payer: Medicare Other | Admitting: Family Medicine

## 2018-05-27 VITALS — BP 118/72 | HR 100 | Temp 98.1°F | Resp 16 | Ht 64.0 in | Wt 115.2 lb

## 2018-05-27 DIAGNOSIS — R309 Painful micturition, unspecified: Secondary | ICD-10-CM | POA: Diagnosis not present

## 2018-05-27 DIAGNOSIS — R110 Nausea: Secondary | ICD-10-CM | POA: Diagnosis not present

## 2018-05-27 DIAGNOSIS — N3000 Acute cystitis without hematuria: Secondary | ICD-10-CM | POA: Diagnosis not present

## 2018-05-27 LAB — POCT URINALYSIS DIPSTICK
Glucose, UA: NEGATIVE
Ketones, UA: NEGATIVE
Protein, UA: POSITIVE — AB
RBC UA: 10
Spec Grav, UA: >=1.03 — AB (ref 1.010–1.025)
Urobilinogen, UA: 1 E.U./dL
pH, UA: 5 (ref 5.0–8.0)

## 2018-05-27 MED ORDER — CEFDINIR 300 MG PO CAPS: 300.0000 mg | ORAL_CAPSULE | Freq: Two times a day (BID) | ORAL | 0 refills | Status: AC

## 2018-05-27 MED ORDER — ONDANSETRON 4 MG PO TBDP: 4.0000 mg | ORAL_TABLET | Freq: Three times a day (TID) | ORAL | 0 refills | Status: DC | PRN

## 2018-05-27 NOTE — Progress Notes (Signed)
Subjective:    Patient ID: Carol Stark, female    DOB: Nov 23, 1931, 85 y.o.   MRN: [id number]  HPI   Patient presents to clinic complaining of burning with urination, increased urinary frequency and pelvic pressure over the past 2 to 3 days.  She began taking over-the-counter Azo yesterday, has noticed minimal relief with this.  Denies fever or chills.  Denies abdominal pain, vomiting or diarrhea.  Does report some nausea, ate a English muffin with peanut butter this morning just to put something light on her stomach.  Patient Active Problem List   Diagnosis Date Noted  . Nausea 03/25/2018  . Microscopic hematuria 01/13/2018  . Persistent proteinuria 01/13/2018  . Abnormal appearance of cervix 07/10/2017  . Cataracts, bilateral 07/10/2017  . Passing gas 07/10/2017  . Primary osteoarthritis of left hip 06/17/2017  . Left hip pain 04/11/2017  . BPPV (benign paroxysmal positional vertigo) 06/29/2016  . Allergic rhinitis 06/05/2016  . Postmenopausal atrophic vaginitis 06/05/2016  . History of hyperlipidemia 02/23/2016  . GERD (gastroesophageal reflux disease) 11/08/2015  . Basal cell carcinoma 10/25/2015  . Constipation 10/22/2015   Social History   Tobacco Use  . Smoking status: Never Smoker  . Smokeless tobacco: Never Used  Substance Use Topics  . Alcohol use: Yes    Alcohol/week: 0.0 standard drinks    Comment: rarely   Review of Systems   Constitutional: Negative for chills, fatigue and fever.  HENT: Negative for congestion, ear pain, sinus pain and sore throat.   Eyes: Negative.   Respiratory: Negative for cough, shortness of breath and wheezing.   Cardiovascular: Negative for chest pain, palpitations and leg swelling.  Gastrointestinal: Negative for abdominal pain, diarrhea, nausea and vomiting.  Genitourinary: +dysuria, frequency and urgency.  Musculoskeletal: Negative for arthralgias and myalgias.  Skin: Negative for color change, pallor and rash.    Neurological: Negative for syncope, light-headedness and headaches.  Psychiatric/Behavioral: The patient is not nervous/anxious.       Objective:   Physical Exam Vitals signs and nursing note reviewed.  Constitutional:      General: She is not in acute distress.    Appearance: She is well-developed. She is not toxic-appearing.  HENT:     Head: Normocephalic and atraumatic.  Eyes:     General: No scleral icterus.    Extraocular Movements: Extraocular movements intact.     Conjunctiva/sclera: Conjunctivae normal.  Neck:     Musculoskeletal: Neck supple.     Trachea: No tracheal deviation.  Cardiovascular:     Rate and Rhythm: Normal rate and regular rhythm.     Heart sounds: Normal heart sounds.  Pulmonary:     Effort: Pulmonary effort is normal. No respiratory distress.     Breath sounds: Normal breath sounds.  Abdominal:     General: Bowel sounds are normal. There is no distension.     Palpations: Abdomen is soft. There is no mass.     Tenderness: There is abdominal tenderness. There is no right CVA tenderness, left CVA tenderness, guarding or rebound.  Skin:    General: Skin is warm and dry.     Coloration: Skin is not jaundiced or pale.  Neurological:     Mental Status: She is alert and oriented to person, place, and time.     Gait: Gait normal.  Psychiatric:        Mood and Affect: Mood normal.        Behavior: Behavior normal.     Vitals:  05/27/18 1352  BP: 118/72  Pulse: 100  Resp: 16  Temp: 98.1 F (36.7 C)  SpO2: 96%      Assessment & Plan:   Urinary tract infection- urinalysis dipstick does show positive leukocytes and nitrites however urine is discolored from Azo.  Due to patient's symptoms, we will treat for UTI and send urine for culture.  She will take Omnicef twice daily for 5 days, increase water intake and follow a bland diet.  She will use Zofran for nausea as needed.  Advised to increase water intake and avoid excess sugary or caffeinated  beverages.  She will be made aware of urine culture results when available.  Advised to return to clinic sooner if any issues arise.

## 2018-05-27 NOTE — Patient Instructions (Signed)
Eat bland foods for next 1 or 2 days, then can return back to your normal diet as tolerated  Criss Rosales Diet A bland diet consists of foods that are often soft and do not have a lot of fat, fiber, or extra seasonings. Foods without fat, fiber, or seasoning are easier for the body to digest. They are also less likely to irritate your mouth, throat, stomach, and other parts of your digestive system. A bland diet is sometimes called a BRAT diet. What is my plan? Your health care provider or food and nutrition specialist (dietitian) may recommend specific changes to your diet to prevent symptoms or to treat your symptoms. These changes may include:  Eating small meals often.  Cooking food until it is soft enough to chew easily.  Chewing your food well.  Drinking fluids slowly.  Not eating foods that are very spicy, sour, or fatty.  Not eating citrus fruits, such as oranges and grapefruit. What do I need to know about this diet?  Eat a variety of foods from the bland diet food list.  Do not follow a bland diet longer than needed.  Ask your health care provider whether you should take vitamins or supplements. What foods can I eat? Grains  Hot cereals, such as cream of wheat. Rice. Bread, crackers, or tortillas made from refined white flour. Vegetables Canned or cooked vegetables. Mashed or boiled potatoes. Fruits  Bananas. Applesauce. Other types of cooked or canned fruit with the skin and seeds removed, such as canned peaches or pears. Meats and other proteins  Scrambled eggs. Creamy peanut butter or other nut butters. Lean, well-cooked meats, such as chicken or fish. Tofu. Soups or broths. Dairy Low-fat dairy products, such as milk, cottage cheese, or yogurt. Beverages  Water. Herbal tea. Apple juice. Fats and oils Mild salad dressings. Canola or olive oil. Sweets and desserts Pudding. Custard. Fruit gelatin. Ice cream. The items listed above may not be a complete list of  recommended foods and beverages. Contact a dietitian for more options. What foods are not recommended? Grains Whole grain breads and cereals. Vegetables Raw vegetables. Fruits Raw fruits, especially citrus, berries, or dried fruits. Dairy Whole fat dairy foods. Beverages Caffeinated drinks. Alcohol. Seasonings and condiments Strongly flavored seasonings or condiments. Hot sauce. Salsa. Other foods Spicy foods. Fried foods. Sour foods, such as pickled or fermented foods. Foods with high sugar content. Foods high in fiber. The items listed above may not be a complete list of foods and beverages to avoid. Contact a dietitian for more information. Summary  A bland diet consists of foods that are often soft and do not have a lot of fat, fiber, or extra seasonings.  Foods without fat, fiber, or seasoning are easier for the body to digest.  Check with your health care provider to see how long you should follow this diet plan. It is not meant to be followed for long periods. This information is not intended to replace advice given to you by your health care provider. Make sure you discuss any questions you have with your health care provider. Document Released: 07/19/2015 Document Revised: 04/25/2017 Document Reviewed: 04/25/2017 Elsevier Interactive Patient Education  2019 [employer].

## 2018-05-29 ENCOUNTER — Telehealth: Payer: Self-pay

## 2018-05-29 LAB — URINE CULTURE
MICRO NUMBER:: 203898
RESULT: NO GROWTH
SPECIMEN QUALITY: ADEQUATE

## 2018-05-29 NOTE — Telephone Encounter (Signed)
Copied from [place] [id number] [id number]. Topic: Quick Communication - Lab Results (Clinic Use ONLY) >> May 29, 2018  3:40 PM Neta Ehlers, [place] wrote: Called patient to inform them of 05/27/2018 lab results. When patient returns call, triage nurse may disclose results. >> May 29, 2018  4:05 PM Jeri Cos wrote: Pt called back per instructions left in her voicemail. Pt was inquiring about the results in her urinalysis.   Pt will be awaiting a return call.

## 2018-05-30 ENCOUNTER — Telehealth: Payer: Self-pay

## 2018-05-30 NOTE — Telephone Encounter (Signed)
Pt giver results and update to stop abx. Pt stated that the burning has decreased.

## 2018-05-30 NOTE — Telephone Encounter (Signed)
Copied from [place] [phone]. Topic: Quick Communication - Rx Refill/Question >> May 30, 2018  9:51 AM Alanda Slim E wrote: Medication: cefdinir (OMNICEF) 300 MG capsule - Pt will run out on 2.22.20 and wants to know if that will complet that dose instruction or will she need to get a refill/ please call Pt and advise

## 2018-05-30 NOTE — Telephone Encounter (Signed)
No refill on antibiotics. She is to STOP this medication now.   Her urine culture was negative for growth. If she is still having urinary symptoms - we will most likely need to do urology referral  How is she doing  *See urine culture result note that was sent yesterday

## 2018-05-30 NOTE — Telephone Encounter (Signed)
Called Pt No answer No Vm. Left message for Pt to call office. Was calling Pt to tell her  There is No refill on antibiotics. She is to STOP this medication now.  Her urine culture was negative for growth. If she is still having urinary symptoms - we will most likely need to do urology referral How is she doing ? * See urine culture result note that was sent yesterday Okay for P[facility] to speak to Pt

## 2018-05-30 NOTE — Telephone Encounter (Signed)
Last OV 04/23/2018  Last refilled 05/27/2018 disp 10 with no refills   Sent ro Philis Nettle

## 2018-05-31 ENCOUNTER — Ambulatory Visit: Payer: Medicare Other | Admitting: Family Medicine

## 2018-06-04 NOTE — Telephone Encounter (Signed)
Called and spoke to Pt about results and not needing a refill on her medication. Pt stated she understood with no questions

## 2018-06-04 NOTE — Telephone Encounter (Signed)
Called and spoke to Pt about not needing a refill on her antibiotic, Pt stated she understood with no questions.

## 2018-06-05 ENCOUNTER — Encounter: Payer: Self-pay | Admitting: Family Medicine

## 2018-06-05 ENCOUNTER — Ambulatory Visit (INDEPENDENT_AMBULATORY_CARE_PROVIDER_SITE_OTHER): Payer: Medicare Other | Admitting: Family Medicine

## 2018-06-05 VITALS — BP 140/60 | HR 93 | Temp 97.9°F | Ht 63.5 in | Wt 115.4 lb

## 2018-06-05 DIAGNOSIS — R413 Other amnesia: Secondary | ICD-10-CM | POA: Diagnosis not present

## 2018-06-05 DIAGNOSIS — H353131 Nonexudative age-related macular degeneration, bilateral, early dry stage: Secondary | ICD-10-CM | POA: Diagnosis not present

## 2018-06-05 NOTE — Patient Instructions (Signed)
Nice to see you. We will check some lab work today to evaluate for causes of your memory difficulty. We will refer you to neurology for evaluation. I will get your DMV forms filled out.

## 2018-06-06 DIAGNOSIS — M6281 Muscle weakness (generalized): Secondary | ICD-10-CM | POA: Diagnosis not present

## 2018-06-06 DIAGNOSIS — R279 Unspecified lack of coordination: Secondary | ICD-10-CM | POA: Diagnosis not present

## 2018-06-06 DIAGNOSIS — F0390 Unspecified dementia without behavioral disturbance: Secondary | ICD-10-CM | POA: Insufficient documentation

## 2018-06-06 DIAGNOSIS — R413 Other amnesia: Principal | ICD-10-CM

## 2018-06-06 DIAGNOSIS — R41841 Cognitive communication deficit: Secondary | ICD-10-CM | POA: Diagnosis not present

## 2018-06-06 DIAGNOSIS — R262 Difficulty in walking, not elsewhere classified: Secondary | ICD-10-CM | POA: Diagnosis not present

## 2018-06-06 LAB — VITAMIN B12: Vitamin B-12: 272 pg/mL (ref 211–911)

## 2018-06-06 LAB — TSH: TSH: 1.58 u[IU]/mL (ref 0.35–4.50)

## 2018-06-06 NOTE — Assessment & Plan Note (Signed)
Patient does appear to have some measure of dementia.  We will obtain TSH and B12 to evaluate for underlying cause.  Will refer to neurology to determine appropriateness of MRI brain versus other imaging.  Will complete her DMV forms.
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
## 2018-06-10 ENCOUNTER — Telehealth: Payer: Self-pay

## 2018-06-10 NOTE — Telephone Encounter (Signed)
Pt states she cannot pick the papers up in office due to not having a car at the moment and would like to see if the forms can be faxed to the [facility].

## 2018-06-10 NOTE — Telephone Encounter (Signed)
Called and informed the patient that her DMV papers with a letter is ready for pickup. Pt states she will pick up tomorrow, informed her the paperwork will be at the front desk.  Gae Bon, CMA

## 2018-06-11 ENCOUNTER — Telehealth: Payer: Self-pay

## 2018-06-11 NOTE — Telephone Encounter (Signed)
Speech therapy orders from [facility] at [place].   PCP is out of the office will return on Monday 06/17/2018.   Placed on Dr. Ellen Henri desk to review and sign.

## 2018-06-11 NOTE — Telephone Encounter (Signed)
Called and informed the pt that her DMV paperwork was faxed and trhe originals were in the folder and she can pick them up at the front desk.  Pt understood.  Gae Bon, CMA

## 2018-06-11 NOTE — Telephone Encounter (Signed)
Spoke with the patient and informed her that her DMV paperwork was faxed and the originals were at the front office for her to pickup, she stated she understood.  Gae Bon, CMA

## 2018-06-11 NOTE — Telephone Encounter (Signed)
Pt would like to know if papers an be faxed to her at [place] where she lives as she is not supposed to be driving until she hears a decision from [facility]. Please advise. Fax#[phone] Attn: Roland Rack Marin

## 2018-06-14 ENCOUNTER — Other Ambulatory Visit: Payer: Self-pay | Admitting: Radiology

## 2018-06-14 ENCOUNTER — Other Ambulatory Visit: Payer: Medicare Other

## 2018-06-14 DIAGNOSIS — E538 Deficiency of other specified B group vitamins: Secondary | ICD-10-CM

## 2018-06-17 NOTE — Telephone Encounter (Signed)
Signed. Please fax.

## 2018-06-18 NOTE — Telephone Encounter (Signed)
Faxed and placed up front for pick up due to fax being down at [facility] at [place].

## 2018-06-19 NOTE — Telephone Encounter (Signed)
Called pt and left a VM that forms were placed up front for pick up due to the fax machine being down at [facility]. CRM created and left a VM.

## 2018-06-19 NOTE — Telephone Encounter (Signed)
Pt called and asked if paperwork could be faxed again to [phone]. Please advise.

## 2018-06-20 ENCOUNTER — Encounter: Payer: Self-pay | Admitting: Family Medicine

## 2018-06-20 ENCOUNTER — Ambulatory Visit (INDEPENDENT_AMBULATORY_CARE_PROVIDER_SITE_OTHER): Payer: Medicare Other | Admitting: Family Medicine

## 2018-06-20 ENCOUNTER — Other Ambulatory Visit: Payer: Self-pay

## 2018-06-20 VITALS — BP 124/66 | HR 96 | Temp 98.0°F | Resp 18 | Wt 115.0 lb

## 2018-06-20 DIAGNOSIS — H6123 Impacted cerumen, bilateral: Secondary | ICD-10-CM | POA: Diagnosis not present

## 2018-06-20 NOTE — Patient Instructions (Signed)
Earwax Buildup, Adult  The ears produce a substance called earwax that helps keep bacteria out of the ear and protects the skin in the ear canal. Occasionally, earwax can build up in the ear and cause discomfort or hearing loss.  What increases the risk?  This condition is more likely to develop in people who:  · Are female.  · Are elderly.  · Naturally produce more earwax.  · Clean their ears often with cotton swabs.  · Use earplugs often.  · Use in-ear headphones often.  · Wear hearing aids.  · Have narrow ear canals.  · Have earwax that is overly thick or sticky.  · Have eczema.  · Are dehydrated.  · Have excess hair in the ear canal.  What are the signs or symptoms?  Symptoms of this condition include:  · Reduced or muffled hearing.  · A feeling of fullness in the ear or feeling that the ear is plugged.  · Fluid coming from the ear.  · Ear pain.  · Ear itch.  · Ringing in the ear.  · Coughing.  · An obvious piece of earwax that can be seen inside the ear canal.  How is this diagnosed?  This condition may be diagnosed based on:  · Your symptoms.  · Your medical history.  · An ear exam. During the exam, your health care provider will look into your ear with an instrument called an otoscope.  You may have tests, including a hearing test.  How is this treated?  This condition may be treated by:  · Using ear drops to soften the earwax.  · Having the earwax removed by a health care provider. The health care provider may:  ? Flush the ear with water.  ? Use an instrument that has a loop on the end (curette).  ? Use a suction device.  · Surgery to remove the wax buildup. This may be done in severe cases.  Follow these instructions at home:    · Take over-the-counter and prescription medicines only as told by your health care provider.  · Do not put any objects, including cotton swabs, into your ear. You can clean the opening of your ear canal with a washcloth or facial tissue.  · Follow instructions from your health care  provider about cleaning your ears. Do not over-clean your ears.  · Drink enough fluid to keep your urine clear or pale yellow. This will help to thin the earwax.  · Keep all follow-up visits as told by your health care provider. If earwax builds up in your ears often or if you use hearing aids, consider seeing your health care provider for routine, preventive ear cleanings. Ask your health care provider how often you should schedule your cleanings.  · If you have hearing aids, clean them according to instructions from the manufacturer and your health care provider.  Contact a health care provider if:  · You have ear pain.  · You develop a fever.  · You have blood, pus, or other fluid coming from your ear.  · You have hearing loss.  · You have ringing in your ears that does not go away.  · Your symptoms do not improve with treatment.  · You feel like the room is spinning (vertigo).  Summary  · Earwax can build up in the ear and cause discomfort or hearing loss.  · The most common symptoms of this condition include reduced or muffled hearing and a feeling of   fullness in the ear or feeling that the ear is plugged.  · This condition may be diagnosed based on your symptoms, your medical history, and an ear exam.  · This condition may be treated by using ear drops to soften the earwax or by having the earwax removed by a health care provider.  · Do not put any objects, including cotton swabs, into your ear. You can clean the opening of your ear canal with a washcloth or facial tissue.  This information is not intended to replace advice given to you by your health care provider. Make sure you discuss any questions you have with your health care provider.  Document Released: 05/04/2004 Document Revised: 03/08/2017 Document Reviewed: 06/07/2016  Elsevier Interactive Patient Education © 2019 Elsevier Inc.
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
## 2018-06-20 NOTE — Telephone Encounter (Signed)
Signed and placed on your desk.

## 2018-06-20 NOTE — Telephone Encounter (Signed)
Forms have been faxed to [place] again have placed this for PCP to sign will refax again once done. Sent to PCP.

## 2018-06-21 DIAGNOSIS — R41841 Cognitive communication deficit: Secondary | ICD-10-CM | POA: Diagnosis not present

## 2018-06-21 DIAGNOSIS — R262 Difficulty in walking, not elsewhere classified: Secondary | ICD-10-CM | POA: Diagnosis not present

## 2018-06-21 DIAGNOSIS — M6281 Muscle weakness (generalized): Secondary | ICD-10-CM | POA: Diagnosis not present

## 2018-06-21 DIAGNOSIS — R279 Unspecified lack of coordination: Secondary | ICD-10-CM | POA: Diagnosis not present

## 2018-06-21 NOTE — Telephone Encounter (Signed)
This has been faxed.

## 2018-06-21 NOTE — Telephone Encounter (Signed)
Called pt to advise Fax was sent today. Pt advised and voiced understanding.

## 2018-06-24 DIAGNOSIS — R41841 Cognitive communication deficit: Secondary | ICD-10-CM | POA: Diagnosis not present

## 2018-06-24 DIAGNOSIS — R262 Difficulty in walking, not elsewhere classified: Secondary | ICD-10-CM | POA: Diagnosis not present

## 2018-06-24 DIAGNOSIS — M6281 Muscle weakness (generalized): Secondary | ICD-10-CM | POA: Diagnosis not present

## 2018-06-24 DIAGNOSIS — R279 Unspecified lack of coordination: Secondary | ICD-10-CM | POA: Diagnosis not present

## 2018-06-28 DIAGNOSIS — R279 Unspecified lack of coordination: Secondary | ICD-10-CM | POA: Diagnosis not present

## 2018-06-28 DIAGNOSIS — R41841 Cognitive communication deficit: Secondary | ICD-10-CM | POA: Diagnosis not present

## 2018-06-28 DIAGNOSIS — R262 Difficulty in walking, not elsewhere classified: Secondary | ICD-10-CM | POA: Diagnosis not present

## 2018-06-28 DIAGNOSIS — M6281 Muscle weakness (generalized): Secondary | ICD-10-CM | POA: Diagnosis not present

## 2018-07-01 DIAGNOSIS — R279 Unspecified lack of coordination: Secondary | ICD-10-CM | POA: Diagnosis not present

## 2018-07-01 DIAGNOSIS — M6281 Muscle weakness (generalized): Secondary | ICD-10-CM | POA: Diagnosis not present

## 2018-07-01 DIAGNOSIS — R41841 Cognitive communication deficit: Secondary | ICD-10-CM | POA: Diagnosis not present

## 2018-07-01 DIAGNOSIS — R262 Difficulty in walking, not elsewhere classified: Secondary | ICD-10-CM | POA: Diagnosis not present

## 2018-07-05 DIAGNOSIS — R41841 Cognitive communication deficit: Secondary | ICD-10-CM | POA: Diagnosis not present

## 2018-07-05 DIAGNOSIS — M6281 Muscle weakness (generalized): Secondary | ICD-10-CM | POA: Diagnosis not present

## 2018-07-05 DIAGNOSIS — R262 Difficulty in walking, not elsewhere classified: Secondary | ICD-10-CM | POA: Diagnosis not present

## 2018-07-05 DIAGNOSIS — R279 Unspecified lack of coordination: Secondary | ICD-10-CM | POA: Diagnosis not present

## 2018-07-08 DIAGNOSIS — R262 Difficulty in walking, not elsewhere classified: Secondary | ICD-10-CM | POA: Diagnosis not present

## 2018-07-08 DIAGNOSIS — M6281 Muscle weakness (generalized): Secondary | ICD-10-CM | POA: Diagnosis not present

## 2018-07-08 DIAGNOSIS — R279 Unspecified lack of coordination: Secondary | ICD-10-CM | POA: Diagnosis not present

## 2018-07-08 DIAGNOSIS — R41841 Cognitive communication deficit: Secondary | ICD-10-CM | POA: Diagnosis not present

## 2018-07-12 DIAGNOSIS — M6281 Muscle weakness (generalized): Secondary | ICD-10-CM | POA: Diagnosis not present

## 2018-07-12 DIAGNOSIS — R279 Unspecified lack of coordination: Secondary | ICD-10-CM | POA: Diagnosis not present

## 2018-07-12 DIAGNOSIS — R41841 Cognitive communication deficit: Secondary | ICD-10-CM | POA: Diagnosis not present

## 2018-07-12 DIAGNOSIS — R262 Difficulty in walking, not elsewhere classified: Secondary | ICD-10-CM | POA: Diagnosis not present

## 2018-07-15 DIAGNOSIS — R41841 Cognitive communication deficit: Secondary | ICD-10-CM | POA: Diagnosis not present

## 2018-07-15 DIAGNOSIS — R279 Unspecified lack of coordination: Secondary | ICD-10-CM | POA: Diagnosis not present

## 2018-07-15 DIAGNOSIS — M6281 Muscle weakness (generalized): Secondary | ICD-10-CM | POA: Diagnosis not present

## 2018-07-15 DIAGNOSIS — R262 Difficulty in walking, not elsewhere classified: Secondary | ICD-10-CM | POA: Diagnosis not present

## 2018-07-18 ENCOUNTER — Telehealth: Payer: Self-pay | Admitting: Family Medicine

## 2018-07-18 NOTE — Telephone Encounter (Signed)
[facility] drop off paperwork. It's in color folder up front. Thank you!

## 2018-07-19 ENCOUNTER — Ambulatory Visit: Payer: Self-pay | Admitting: Family Medicine

## 2018-07-19 NOTE — Telephone Encounter (Signed)
Pt. Calling to report dizziness and nausea. She called EMS and they are with pt. Report her vital signs are stable and refuse to go to ED. Pt. Wants office visit. Explained we are doing virtual and/or phone visits. Pt. Confused as to what Web visits are. C/o nausea and dizziness this morning. No vertigo. Drinking well.Instructed if symptoms worsen before tomorrow's visit to call EMS. Verbalizes understanding.

## 2018-07-19 NOTE — Telephone Encounter (Signed)
   Reason for Disposition . [1] MODERATE dizziness (e.g., interferes with normal activities) AND [2] has NOT been evaluated by physician for this  (Exception: dizziness caused by heat exposure, sudden standing, or poor fluid intake)  Answer Assessment - Initial Assessment Questions 1. DESCRIPTION: "Describe your dizziness."     Lihjtheadedness 2. LIGHTHEADED: "Do you feel lightheaded?" (e.g., somewhat faint, woozy, weak upon standing)     Yes 3. VERTIGO: "Do you feel like either you or the room is spinning or tilting?" (i.e. vertigo)     No 4. SEVERITY: "How bad is it?"  "Do you feel like you are going to faint?" "Can you stand and walk?"   - MILD - walking normally   - MODERATE - interferes with normal activities (e.g., work, school)    - SEVERE - unable to stand, requires support to walk, feels like passing out now.      Mild 5. ONSET:  "When did the dizziness begin?"     Started yesterday and last night 6. AGGRAVATING FACTORS: "Does anything make it worse?" (e.g., standing, change in head position)     Nothing 7. HEART RATE: "Can you tell me your heart rate?" "How many beats in 15 seconds?"  (Note: not all patients can do this)       No 8. CAUSE: "What do you think is causing the dizziness?"     Nausea 9. RECURRENT SYMPTOM: "Have you had dizziness before?" If so, ask: "When was the last time?" "What happened that time?"     No 10. OTHER SYMPTOMS: "Do you have any other symptoms?" (e.g., fever, chest pain, vomiting, diarrhea, bleeding)       Nausea 11. PREGNANCY: "Is there any chance you are pregnant?" "When was your last menstrual period?"       n/a  Protocols used: DIZZINESS [facility]

## 2018-07-20 ENCOUNTER — Ambulatory Visit: Payer: Medicare Other | Admitting: Family Medicine

## 2018-07-20 NOTE — Telephone Encounter (Signed)
Called Pt to pre-chart visit for today. Pt stated that she wasn't feeling well , dizzy, confused, and wanted to cancel appt today. Spoke to Dr. Zigmund Daniel about the call. Per Dr. Zigmund Daniel, call Pt back and advise her to call 911. She needs to be physically seen. RCB to Pt. Advised her of what Dr. Zigmund Daniel stated. Pt acknowledged his advisement.

## 2018-07-20 NOTE — Telephone Encounter (Signed)
Contacted Pt. Documented phone encounter. Pt advised to call 911 to go to hospital.

## 2018-07-22 ENCOUNTER — Ambulatory Visit: Payer: Self-pay | Admitting: *Deleted

## 2018-07-22 DIAGNOSIS — M6281 Muscle weakness (generalized): Secondary | ICD-10-CM | POA: Diagnosis not present

## 2018-07-22 DIAGNOSIS — R279 Unspecified lack of coordination: Secondary | ICD-10-CM | POA: Diagnosis not present

## 2018-07-22 DIAGNOSIS — R262 Difficulty in walking, not elsewhere classified: Secondary | ICD-10-CM | POA: Diagnosis not present

## 2018-07-22 DIAGNOSIS — R41841 Cognitive communication deficit: Secondary | ICD-10-CM | POA: Diagnosis not present

## 2018-07-22 NOTE — Telephone Encounter (Signed)
Called and spoke with pt. Pt has not went to the ED and does not feel so well today feeling nauseated but has no car to go to the ED. Pt stated that she will call us tomorrow to inform us if she plans to go and how she is feeling as well. Pt was advised to go to ED and to call for a taxi or uber to take her there if needed.   Pt advised and voiced understanding will call pt back tomorrow to check in.

## 2018-07-22 NOTE — Telephone Encounter (Signed)
Please contact the patient to see if she was evaluated in the ED. Please see how she is feeling as well. Thanks.

## 2018-07-22 NOTE — Telephone Encounter (Signed)
See previous telephone encounters from today. Patient is returning PCP's call from earlier. She was triaged on Friday and and spoke with [place] today who advised her to go to the ED. Patient has been experiencing nausea intermittently since Friday. She denies fever/CP/SOB/Abdominal pain/diarrhea/vomiting. Stated she is drinking and eating some today. No difficulty voiding. She will call tomorrow if no improvement or worse. Reviewed if she has vomiting/difficulty breathing/pain to phone 911 for evaluation. Stated she would.   Answer Assessment - Initial Assessment Questions 1. REASON FOR CALL or QUESTION: "What is your reason for calling today?" or "How can I best help you?" or "What question do you have that I can help answer?"     Returning PCP's call. 2. CALLER: Document the source of call. (e.g., laboratory, patient).     Patient.  Protocols used: PCP CALL - NO TRIAGE-A-AH

## 2018-07-22 NOTE — Telephone Encounter (Signed)
Called pt and she stated that yes she plans to go and had to get off the phone with me due to another phone call pt stated that she will call back.

## 2018-07-22 NOTE — Telephone Encounter (Signed)
Sent to PCP as an FYI  

## 2018-07-23 NOTE — Telephone Encounter (Signed)
Called and check in with pt. Pt stated that she is just taking it day by day and she dose feel like she needs to go to the ER if she feels that she needs to be seen she will call to set up for a virtual visit.

## 2018-07-26 DIAGNOSIS — M6281 Muscle weakness (generalized): Secondary | ICD-10-CM | POA: Diagnosis not present

## 2018-07-26 DIAGNOSIS — R279 Unspecified lack of coordination: Secondary | ICD-10-CM | POA: Diagnosis not present

## 2018-07-26 DIAGNOSIS — R262 Difficulty in walking, not elsewhere classified: Secondary | ICD-10-CM | POA: Diagnosis not present

## 2018-07-26 DIAGNOSIS — R41841 Cognitive communication deficit: Secondary | ICD-10-CM | POA: Diagnosis not present

## 2018-07-29 ENCOUNTER — Other Ambulatory Visit: Payer: Self-pay | Admitting: Family Medicine

## 2018-07-29 DIAGNOSIS — R279 Unspecified lack of coordination: Secondary | ICD-10-CM | POA: Diagnosis not present

## 2018-07-29 DIAGNOSIS — M6281 Muscle weakness (generalized): Secondary | ICD-10-CM | POA: Diagnosis not present

## 2018-07-29 DIAGNOSIS — R41841 Cognitive communication deficit: Secondary | ICD-10-CM | POA: Diagnosis not present

## 2018-07-29 DIAGNOSIS — R262 Difficulty in walking, not elsewhere classified: Secondary | ICD-10-CM | POA: Diagnosis not present

## 2018-08-02 DIAGNOSIS — R279 Unspecified lack of coordination: Secondary | ICD-10-CM | POA: Diagnosis not present

## 2018-08-02 DIAGNOSIS — R41841 Cognitive communication deficit: Secondary | ICD-10-CM | POA: Diagnosis not present

## 2018-08-02 DIAGNOSIS — R262 Difficulty in walking, not elsewhere classified: Secondary | ICD-10-CM | POA: Diagnosis not present

## 2018-08-02 DIAGNOSIS — M6281 Muscle weakness (generalized): Secondary | ICD-10-CM | POA: Diagnosis not present

## 2018-08-05 DIAGNOSIS — R262 Difficulty in walking, not elsewhere classified: Secondary | ICD-10-CM | POA: Diagnosis not present

## 2018-08-05 DIAGNOSIS — R41841 Cognitive communication deficit: Secondary | ICD-10-CM | POA: Diagnosis not present

## 2018-08-05 DIAGNOSIS — M6281 Muscle weakness (generalized): Secondary | ICD-10-CM | POA: Diagnosis not present

## 2018-08-05 DIAGNOSIS — R279 Unspecified lack of coordination: Secondary | ICD-10-CM | POA: Diagnosis not present

## 2018-08-09 DIAGNOSIS — R262 Difficulty in walking, not elsewhere classified: Secondary | ICD-10-CM | POA: Diagnosis not present

## 2018-08-09 DIAGNOSIS — M6281 Muscle weakness (generalized): Secondary | ICD-10-CM | POA: Diagnosis not present

## 2018-08-09 DIAGNOSIS — R41841 Cognitive communication deficit: Secondary | ICD-10-CM | POA: Diagnosis not present

## 2018-08-09 DIAGNOSIS — R279 Unspecified lack of coordination: Secondary | ICD-10-CM | POA: Diagnosis not present

## 2018-08-12 DIAGNOSIS — R262 Difficulty in walking, not elsewhere classified: Secondary | ICD-10-CM | POA: Diagnosis not present

## 2018-08-12 DIAGNOSIS — R279 Unspecified lack of coordination: Secondary | ICD-10-CM | POA: Diagnosis not present

## 2018-08-12 DIAGNOSIS — M6281 Muscle weakness (generalized): Secondary | ICD-10-CM | POA: Diagnosis not present

## 2018-08-12 DIAGNOSIS — R41841 Cognitive communication deficit: Secondary | ICD-10-CM | POA: Diagnosis not present

## 2018-08-16 DIAGNOSIS — R279 Unspecified lack of coordination: Secondary | ICD-10-CM | POA: Diagnosis not present

## 2018-08-16 DIAGNOSIS — M6281 Muscle weakness (generalized): Secondary | ICD-10-CM | POA: Diagnosis not present

## 2018-08-16 DIAGNOSIS — R41841 Cognitive communication deficit: Secondary | ICD-10-CM | POA: Diagnosis not present

## 2018-08-16 DIAGNOSIS — R262 Difficulty in walking, not elsewhere classified: Secondary | ICD-10-CM | POA: Diagnosis not present

## 2018-08-19 DIAGNOSIS — R279 Unspecified lack of coordination: Secondary | ICD-10-CM | POA: Diagnosis not present

## 2018-08-19 DIAGNOSIS — R262 Difficulty in walking, not elsewhere classified: Secondary | ICD-10-CM | POA: Diagnosis not present

## 2018-08-19 DIAGNOSIS — R41841 Cognitive communication deficit: Secondary | ICD-10-CM | POA: Diagnosis not present

## 2018-08-19 DIAGNOSIS — M6281 Muscle weakness (generalized): Secondary | ICD-10-CM | POA: Diagnosis not present

## 2018-08-23 DIAGNOSIS — R262 Difficulty in walking, not elsewhere classified: Secondary | ICD-10-CM | POA: Diagnosis not present

## 2018-08-23 DIAGNOSIS — R279 Unspecified lack of coordination: Secondary | ICD-10-CM | POA: Diagnosis not present

## 2018-08-23 DIAGNOSIS — M6281 Muscle weakness (generalized): Secondary | ICD-10-CM | POA: Diagnosis not present

## 2018-08-23 DIAGNOSIS — R41841 Cognitive communication deficit: Secondary | ICD-10-CM | POA: Diagnosis not present

## 2018-08-26 DIAGNOSIS — R262 Difficulty in walking, not elsewhere classified: Secondary | ICD-10-CM | POA: Diagnosis not present

## 2018-08-26 DIAGNOSIS — R41841 Cognitive communication deficit: Secondary | ICD-10-CM | POA: Diagnosis not present

## 2018-08-26 DIAGNOSIS — R279 Unspecified lack of coordination: Secondary | ICD-10-CM | POA: Diagnosis not present

## 2018-08-26 DIAGNOSIS — M6281 Muscle weakness (generalized): Secondary | ICD-10-CM | POA: Diagnosis not present

## 2018-08-30 DIAGNOSIS — M6281 Muscle weakness (generalized): Secondary | ICD-10-CM | POA: Diagnosis not present

## 2018-08-30 DIAGNOSIS — R279 Unspecified lack of coordination: Secondary | ICD-10-CM | POA: Diagnosis not present

## 2018-08-30 DIAGNOSIS — R262 Difficulty in walking, not elsewhere classified: Secondary | ICD-10-CM | POA: Diagnosis not present

## 2018-08-30 DIAGNOSIS — R41841 Cognitive communication deficit: Secondary | ICD-10-CM | POA: Diagnosis not present

## 2018-09-04 DIAGNOSIS — M6281 Muscle weakness (generalized): Secondary | ICD-10-CM | POA: Diagnosis not present

## 2018-09-04 DIAGNOSIS — R41841 Cognitive communication deficit: Secondary | ICD-10-CM | POA: Diagnosis not present

## 2018-09-04 DIAGNOSIS — R279 Unspecified lack of coordination: Secondary | ICD-10-CM | POA: Diagnosis not present

## 2018-09-04 DIAGNOSIS — R262 Difficulty in walking, not elsewhere classified: Secondary | ICD-10-CM | POA: Diagnosis not present

## 2018-09-06 DIAGNOSIS — R279 Unspecified lack of coordination: Secondary | ICD-10-CM | POA: Diagnosis not present

## 2018-09-06 DIAGNOSIS — R262 Difficulty in walking, not elsewhere classified: Secondary | ICD-10-CM | POA: Diagnosis not present

## 2018-09-06 DIAGNOSIS — R41841 Cognitive communication deficit: Secondary | ICD-10-CM | POA: Diagnosis not present

## 2018-09-06 DIAGNOSIS — M6281 Muscle weakness (generalized): Secondary | ICD-10-CM | POA: Diagnosis not present

## 2018-09-09 DIAGNOSIS — M6281 Muscle weakness (generalized): Secondary | ICD-10-CM | POA: Diagnosis not present

## 2018-09-09 DIAGNOSIS — R41841 Cognitive communication deficit: Secondary | ICD-10-CM | POA: Diagnosis not present

## 2018-09-09 DIAGNOSIS — R279 Unspecified lack of coordination: Secondary | ICD-10-CM | POA: Diagnosis not present

## 2018-09-09 DIAGNOSIS — R262 Difficulty in walking, not elsewhere classified: Secondary | ICD-10-CM | POA: Diagnosis not present

## 2018-09-16 DIAGNOSIS — R262 Difficulty in walking, not elsewhere classified: Secondary | ICD-10-CM | POA: Diagnosis not present

## 2018-09-16 DIAGNOSIS — M6281 Muscle weakness (generalized): Secondary | ICD-10-CM | POA: Diagnosis not present

## 2018-09-16 DIAGNOSIS — R279 Unspecified lack of coordination: Secondary | ICD-10-CM | POA: Diagnosis not present

## 2018-09-16 DIAGNOSIS — R41841 Cognitive communication deficit: Secondary | ICD-10-CM | POA: Diagnosis not present

## 2018-09-20 DIAGNOSIS — M6281 Muscle weakness (generalized): Secondary | ICD-10-CM | POA: Diagnosis not present

## 2018-09-20 DIAGNOSIS — R41841 Cognitive communication deficit: Secondary | ICD-10-CM | POA: Diagnosis not present

## 2018-09-20 DIAGNOSIS — R279 Unspecified lack of coordination: Secondary | ICD-10-CM | POA: Diagnosis not present

## 2018-09-20 DIAGNOSIS — R262 Difficulty in walking, not elsewhere classified: Secondary | ICD-10-CM | POA: Diagnosis not present

## 2018-09-23 ENCOUNTER — Ambulatory Visit: Payer: Medicare Other | Admitting: Family Medicine

## 2018-09-23 DIAGNOSIS — M6281 Muscle weakness (generalized): Secondary | ICD-10-CM | POA: Diagnosis not present

## 2018-09-23 DIAGNOSIS — R262 Difficulty in walking, not elsewhere classified: Secondary | ICD-10-CM | POA: Diagnosis not present

## 2018-09-23 DIAGNOSIS — R279 Unspecified lack of coordination: Secondary | ICD-10-CM | POA: Diagnosis not present

## 2018-09-23 DIAGNOSIS — R41841 Cognitive communication deficit: Secondary | ICD-10-CM | POA: Diagnosis not present

## 2018-09-27 DIAGNOSIS — R5383 Other fatigue: Secondary | ICD-10-CM | POA: Diagnosis not present

## 2018-09-27 DIAGNOSIS — R42 Dizziness and giddiness: Secondary | ICD-10-CM | POA: Diagnosis not present

## 2018-09-27 DIAGNOSIS — R110 Nausea: Secondary | ICD-10-CM | POA: Diagnosis not present

## 2018-09-27 DIAGNOSIS — R8290 Unspecified abnormal findings in urine: Secondary | ICD-10-CM | POA: Diagnosis not present

## 2018-09-27 DIAGNOSIS — Z03818 Encounter for observation for suspected exposure to other biological agents ruled out: Secondary | ICD-10-CM | POA: Diagnosis not present

## 2018-09-30 DIAGNOSIS — R41841 Cognitive communication deficit: Secondary | ICD-10-CM | POA: Diagnosis not present

## 2018-09-30 DIAGNOSIS — M6281 Muscle weakness (generalized): Secondary | ICD-10-CM | POA: Diagnosis not present

## 2018-09-30 DIAGNOSIS — R279 Unspecified lack of coordination: Secondary | ICD-10-CM | POA: Diagnosis not present

## 2018-09-30 DIAGNOSIS — R262 Difficulty in walking, not elsewhere classified: Secondary | ICD-10-CM | POA: Diagnosis not present

## 2018-10-04 DIAGNOSIS — R279 Unspecified lack of coordination: Secondary | ICD-10-CM | POA: Diagnosis not present

## 2018-10-04 DIAGNOSIS — M6281 Muscle weakness (generalized): Secondary | ICD-10-CM | POA: Diagnosis not present

## 2018-10-04 DIAGNOSIS — R41841 Cognitive communication deficit: Secondary | ICD-10-CM | POA: Diagnosis not present

## 2018-10-04 DIAGNOSIS — R262 Difficulty in walking, not elsewhere classified: Secondary | ICD-10-CM | POA: Diagnosis not present

## 2018-10-07 ENCOUNTER — Telehealth: Payer: Self-pay | Admitting: Family Medicine

## 2018-10-07 DIAGNOSIS — R801 Persistent proteinuria, unspecified: Secondary | ICD-10-CM

## 2018-10-07 DIAGNOSIS — R41841 Cognitive communication deficit: Secondary | ICD-10-CM | POA: Diagnosis not present

## 2018-10-07 DIAGNOSIS — M6281 Muscle weakness (generalized): Secondary | ICD-10-CM | POA: Diagnosis not present

## 2018-10-07 DIAGNOSIS — R279 Unspecified lack of coordination: Secondary | ICD-10-CM | POA: Diagnosis not present

## 2018-10-07 DIAGNOSIS — R262 Difficulty in walking, not elsewhere classified: Secondary | ICD-10-CM | POA: Diagnosis not present

## 2018-10-07 DIAGNOSIS — R3129 Other microscopic hematuria: Secondary | ICD-10-CM

## 2018-10-07 NOTE — Telephone Encounter (Signed)
Referral placed.

## 2018-10-07 NOTE — Telephone Encounter (Signed)
Patient called stating she needs to referral to nephrology, Referral has expired.  Patient referral was to Doretha Sou, [facility] at [place]  [address] [place], [place] [address] [phone] PHONE [phone] FAX  Patient call back # [phone]

## 2018-10-09 DIAGNOSIS — R279 Unspecified lack of coordination: Secondary | ICD-10-CM | POA: Diagnosis not present

## 2018-10-09 DIAGNOSIS — M6281 Muscle weakness (generalized): Secondary | ICD-10-CM | POA: Diagnosis not present

## 2018-10-09 DIAGNOSIS — R41841 Cognitive communication deficit: Secondary | ICD-10-CM | POA: Diagnosis not present

## 2018-10-09 DIAGNOSIS — R262 Difficulty in walking, not elsewhere classified: Secondary | ICD-10-CM | POA: Diagnosis not present

## 2018-10-11 DIAGNOSIS — R262 Difficulty in walking, not elsewhere classified: Secondary | ICD-10-CM | POA: Diagnosis not present

## 2018-10-11 DIAGNOSIS — R41841 Cognitive communication deficit: Secondary | ICD-10-CM | POA: Diagnosis not present

## 2018-10-11 DIAGNOSIS — M6281 Muscle weakness (generalized): Secondary | ICD-10-CM | POA: Diagnosis not present

## 2018-10-11 DIAGNOSIS — R279 Unspecified lack of coordination: Secondary | ICD-10-CM | POA: Diagnosis not present

## 2018-10-14 DIAGNOSIS — M6281 Muscle weakness (generalized): Secondary | ICD-10-CM | POA: Diagnosis not present

## 2018-10-14 DIAGNOSIS — R279 Unspecified lack of coordination: Secondary | ICD-10-CM | POA: Diagnosis not present

## 2018-10-14 DIAGNOSIS — R262 Difficulty in walking, not elsewhere classified: Secondary | ICD-10-CM | POA: Diagnosis not present

## 2018-10-14 DIAGNOSIS — R41841 Cognitive communication deficit: Secondary | ICD-10-CM | POA: Diagnosis not present

## 2018-10-16 DIAGNOSIS — M6281 Muscle weakness (generalized): Secondary | ICD-10-CM | POA: Diagnosis not present

## 2018-10-16 DIAGNOSIS — R262 Difficulty in walking, not elsewhere classified: Secondary | ICD-10-CM | POA: Diagnosis not present

## 2018-10-16 DIAGNOSIS — R41841 Cognitive communication deficit: Secondary | ICD-10-CM | POA: Diagnosis not present

## 2018-10-16 DIAGNOSIS — R279 Unspecified lack of coordination: Secondary | ICD-10-CM | POA: Diagnosis not present

## 2018-10-18 DIAGNOSIS — R262 Difficulty in walking, not elsewhere classified: Secondary | ICD-10-CM | POA: Diagnosis not present

## 2018-10-18 DIAGNOSIS — R279 Unspecified lack of coordination: Secondary | ICD-10-CM | POA: Diagnosis not present

## 2018-10-18 DIAGNOSIS — M6281 Muscle weakness (generalized): Secondary | ICD-10-CM | POA: Diagnosis not present

## 2018-10-18 DIAGNOSIS — R41841 Cognitive communication deficit: Secondary | ICD-10-CM | POA: Diagnosis not present

## 2018-10-21 ENCOUNTER — Telehealth: Payer: Self-pay | Admitting: Family Medicine

## 2018-10-21 DIAGNOSIS — M6281 Muscle weakness (generalized): Secondary | ICD-10-CM | POA: Diagnosis not present

## 2018-10-21 DIAGNOSIS — R41841 Cognitive communication deficit: Secondary | ICD-10-CM | POA: Diagnosis not present

## 2018-10-21 DIAGNOSIS — R279 Unspecified lack of coordination: Secondary | ICD-10-CM | POA: Diagnosis not present

## 2018-10-21 DIAGNOSIS — R262 Difficulty in walking, not elsewhere classified: Secondary | ICD-10-CM | POA: Diagnosis not present

## 2018-10-21 NOTE — Telephone Encounter (Signed)
Aaron Edelman called from [facility] regarding patient is going to be moved to the memory part of the facility but states patient needs an actual diagnoses from PCP that patient has dementia.  Aaron Edelman states he did fax in paperwork last week regarding.   Aaron Edelman call back [phone]

## 2018-10-22 ENCOUNTER — Encounter: Payer: Self-pay | Admitting: Family Medicine

## 2018-10-22 ENCOUNTER — Ambulatory Visit: Payer: Medicare Other | Admitting: Family Medicine

## 2018-10-22 ENCOUNTER — Other Ambulatory Visit: Payer: Self-pay

## 2018-10-22 ENCOUNTER — Ambulatory Visit (INDEPENDENT_AMBULATORY_CARE_PROVIDER_SITE_OTHER): Payer: Medicare Other | Admitting: Family Medicine

## 2018-10-22 ENCOUNTER — Telehealth: Payer: Self-pay | Admitting: Family Medicine

## 2018-10-22 VITALS — BP 120/80 | HR 93 | Temp 96.2°F | Ht 64.0 in | Wt 106.8 lb

## 2018-10-22 DIAGNOSIS — F0390 Unspecified dementia without behavioral disturbance: Secondary | ICD-10-CM | POA: Diagnosis not present

## 2018-10-22 DIAGNOSIS — G309 Alzheimer's disease, unspecified: Secondary | ICD-10-CM | POA: Diagnosis not present

## 2018-10-22 DIAGNOSIS — R3129 Other microscopic hematuria: Secondary | ICD-10-CM

## 2018-10-22 DIAGNOSIS — E538 Deficiency of other specified B group vitamins: Secondary | ICD-10-CM

## 2018-10-22 NOTE — Telephone Encounter (Signed)
Please call [facility] in [place] [phone]) to see if the patient has been seen there previously and to see if she has an upcoming appointment. I am trying to confirm whether or not she has followed through with the referrals that have been advised. Thanks.

## 2018-10-22 NOTE — Progress Notes (Signed)
Tommi Rumps, MD Phone: [phone]  Carol Stark is a 85 y.o. female who presents today for follow-up.  Dementia: Patient presents for evaluation for dementia.  She is unaware of any memory issues though there is concern from her facility that she has memory difficulties.  She denies depression.  We have previously discussed this with the patient though she does not remember discussing her memory difficulty.  She repeatedly asked about being able to see her friends who are in assisted living despite me telling her multiple times that due to COVID-19 her facility has likely limited socialization and patient contact with each other.  Prior B12 was borderline low.  She reports that she feeds herself, dresses herself, bathes herself, and toilets on her own.  She reports managing her finances.  She does cook for herself per patient report.  No driving.  MMSE - Mini Mental State Exam 10/22/2018  Orientation to time 4  Orientation to Place 2  Registration 3  Attention/ Calculation 1  Recall 2  Language- name 2 objects 2  Language- repeat 1  Language- follow 3 step command 3  Language- read & follow direction 1  Write a sentence 1  Copy design 0  Total score 20    Microscopic hematuria: Patient did not see urology due to the COVID-19 pandemic per her report.  It appears she may have had some telephone contact with nephrology at [facility].   Social History   Tobacco Use  Smoking Status Never Smoker  Smokeless Tobacco Never Used     ROS see history of present illness  Objective  Physical Exam Vitals:   10/22/18 1401  BP: 120/80  Pulse: 93  Temp: (!) 96.2 F (35.7 C)  SpO2: 98%    BP Readings from Last 3 Encounters:  10/22/18 120/80  06/20/18 124/66  06/05/18 140/60   Wt Readings from Last 3 Encounters:  10/22/18 106 lb 12.8 oz (48.4 kg)  06/20/18 115 lb (52.2 kg)  06/05/18 115 lb 6.4 oz (52.3 kg)    Physical Exam Constitutional:      Appearance: Normal  appearance.  Pulmonary:     Effort: Pulmonary effort is normal.  Neurological:     Mental Status: She is alert.     Comments: Normal gait      Assessment/Plan: Please see individual problem list.  Dementia ([place]) She does appear to meet criteria for dementia based on her MMSE score.  Per her report it does not seem to have affected her ADLs yet.  Discussed repeat B12 and additional testing to evaluate for low B12.  Also ordered MRI brain to evaluate for underlying cause.  If testing is negative we could diagnose her with Alzheimer's dementia.  Microscopic hematuria I will have our CMA contact the [facility] nephrology office to see if the patient has been seen there when her next appointment is.  I discussed the next she should see urology as well though we will see about her nephrology evaluation prior to rearranging for her to see urology.   Orders Placed This Encounter  Procedures  . MR Brain Wo Contrast    Standing Status:   Future    Standing Expiration Date:   12/23/2019    Order Specific Question:   What is the patient's sedation requirement?    Answer:   No Sedation    Order Specific Question:   Does the patient have a pacemaker or implanted devices?    Answer:   No    Order  Specific Question:   Preferred imaging location?    Answer:   [facility] (table limit-400lbs)    Order Specific Question:   Radiology Contrast Protocol - do NOT remove file path    Answer:   [email or web address]  . B12  . Methylmalonic Acid  . Homocysteine    No orders of the defined types were placed in this encounter.    Tommi Rumps, MD [employer]

## 2018-10-22 NOTE — Assessment & Plan Note (Signed)
I will have our [place] contact the [facility] nephrology office to see if the patient has been seen there when her next appointment is.  I discussed the next she should see urology as well though we will see about her nephrology evaluation prior to rearranging for her to see urology.

## 2018-10-22 NOTE — Patient Instructions (Signed)
Nice to see you. We will get labs and a MRI of your brain to evaluate for a cause of your memory issues.  You need to keep your appointment with nephrology and you need to see urology for the prior blood in your urine.

## 2018-10-22 NOTE — Assessment & Plan Note (Signed)
She does appear to meet criteria for dementia based on her MMSE score.  Per her report it does not seem to have affected her ADLs yet.  Discussed repeat B12 and additional testing to evaluate for low B12.  Also ordered MRI brain to evaluate for underlying cause.  If testing is negative we could diagnose her with Alzheimer's dementia.

## 2018-10-23 DIAGNOSIS — R262 Difficulty in walking, not elsewhere classified: Secondary | ICD-10-CM | POA: Diagnosis not present

## 2018-10-23 DIAGNOSIS — R279 Unspecified lack of coordination: Secondary | ICD-10-CM | POA: Diagnosis not present

## 2018-10-23 DIAGNOSIS — M6281 Muscle weakness (generalized): Secondary | ICD-10-CM | POA: Diagnosis not present

## 2018-10-23 DIAGNOSIS — R41841 Cognitive communication deficit: Secondary | ICD-10-CM | POA: Diagnosis not present

## 2018-10-23 LAB — VITAMIN B12: Vitamin B-12: 242 pg/mL (ref 211–911)

## 2018-10-23 NOTE — Telephone Encounter (Signed)
I called to [facility] and the nurse stated the patient has a  Confirmed appointment I person on September 4 @ 9 am.  Lesia Hausen

## 2018-10-23 NOTE — Telephone Encounter (Signed)
Good to hear!  Thanks

## 2018-10-25 DIAGNOSIS — R279 Unspecified lack of coordination: Secondary | ICD-10-CM | POA: Diagnosis not present

## 2018-10-25 DIAGNOSIS — R41841 Cognitive communication deficit: Secondary | ICD-10-CM | POA: Diagnosis not present

## 2018-10-25 DIAGNOSIS — R262 Difficulty in walking, not elsewhere classified: Secondary | ICD-10-CM | POA: Diagnosis not present

## 2018-10-25 DIAGNOSIS — M6281 Muscle weakness (generalized): Secondary | ICD-10-CM | POA: Diagnosis not present

## 2018-10-25 LAB — METHYLMALONIC ACID, SERUM: Methylmalonic Acid, Quant: 178 nmol/L (ref 87–318)

## 2018-10-25 LAB — HOMOCYSTEINE: Homocysteine: 15.2 umol/L — ABNORMAL HIGH (ref <10.4)

## 2018-10-28 DIAGNOSIS — R279 Unspecified lack of coordination: Secondary | ICD-10-CM | POA: Diagnosis not present

## 2018-10-28 DIAGNOSIS — M6281 Muscle weakness (generalized): Secondary | ICD-10-CM | POA: Diagnosis not present

## 2018-10-28 DIAGNOSIS — R41841 Cognitive communication deficit: Secondary | ICD-10-CM | POA: Diagnosis not present

## 2018-10-28 DIAGNOSIS — R262 Difficulty in walking, not elsewhere classified: Secondary | ICD-10-CM | POA: Diagnosis not present

## 2018-10-29 DIAGNOSIS — M6281 Muscle weakness (generalized): Secondary | ICD-10-CM | POA: Diagnosis not present

## 2018-10-29 DIAGNOSIS — R41841 Cognitive communication deficit: Secondary | ICD-10-CM | POA: Diagnosis not present

## 2018-10-29 DIAGNOSIS — R262 Difficulty in walking, not elsewhere classified: Secondary | ICD-10-CM | POA: Diagnosis not present

## 2018-10-29 DIAGNOSIS — R279 Unspecified lack of coordination: Secondary | ICD-10-CM | POA: Diagnosis not present

## 2018-10-30 ENCOUNTER — Telehealth: Payer: Self-pay | Admitting: Family Medicine

## 2018-10-30 DIAGNOSIS — R262 Difficulty in walking, not elsewhere classified: Secondary | ICD-10-CM | POA: Diagnosis not present

## 2018-10-30 DIAGNOSIS — M6281 Muscle weakness (generalized): Secondary | ICD-10-CM | POA: Diagnosis not present

## 2018-10-30 DIAGNOSIS — R41841 Cognitive communication deficit: Secondary | ICD-10-CM | POA: Diagnosis not present

## 2018-10-30 DIAGNOSIS — R279 Unspecified lack of coordination: Secondary | ICD-10-CM | POA: Diagnosis not present

## 2018-10-30 NOTE — Telephone Encounter (Signed)
Aaron Edelman w/[facility] [phone] would like for the provider to know that he faxed over a FL2 form for the patient.  He would like it signed by the provider and sent back as soon as you can.

## 2018-10-30 NOTE — Telephone Encounter (Signed)
Singed and faxed to facility.  Ravleen Ries,cma

## 2018-11-01 ENCOUNTER — Ambulatory Visit: Payer: Medicare Other

## 2018-11-01 ENCOUNTER — Ambulatory Visit: Payer: Medicare Other | Admitting: Family Medicine

## 2018-11-01 DIAGNOSIS — M6281 Muscle weakness (generalized): Secondary | ICD-10-CM | POA: Diagnosis not present

## 2018-11-01 DIAGNOSIS — R262 Difficulty in walking, not elsewhere classified: Secondary | ICD-10-CM | POA: Diagnosis not present

## 2018-11-01 DIAGNOSIS — R279 Unspecified lack of coordination: Secondary | ICD-10-CM | POA: Diagnosis not present

## 2018-11-01 DIAGNOSIS — R41841 Cognitive communication deficit: Secondary | ICD-10-CM | POA: Diagnosis not present

## 2018-11-04 ENCOUNTER — Encounter
Admission: RE | Admit: 2018-11-04 | Discharge: 2018-11-04 | Disposition: A | Discharge status: Home / Self Care | Payer: Medicare Other | Source: Ambulatory Visit | Attending: Internal Medicine | Admitting: Internal Medicine

## 2018-11-04 DIAGNOSIS — R262 Difficulty in walking, not elsewhere classified: Secondary | ICD-10-CM | POA: Diagnosis not present

## 2018-11-04 DIAGNOSIS — R279 Unspecified lack of coordination: Secondary | ICD-10-CM | POA: Diagnosis not present

## 2018-11-04 DIAGNOSIS — R41841 Cognitive communication deficit: Secondary | ICD-10-CM | POA: Diagnosis not present

## 2018-11-04 DIAGNOSIS — M6281 Muscle weakness (generalized): Secondary | ICD-10-CM | POA: Diagnosis not present

## 2018-11-04 NOTE — Telephone Encounter (Signed)
Carol Stark called in stating he has not received fax. Please attention to Carol Stark and fax number is [phone]. Please do before lunch as Carol Stark stated if he does not have it by then he will drive to office. Please advise.

## 2018-11-04 NOTE — Telephone Encounter (Signed)
Carol Stark from [facility] called stating that he sent his driver to [facility] pick the [facility] form for pt but driver was given a therapy note instead.  Carol Stark said that he is sending the driver, Carol Stark (BJ) back to clinic to pick up correct form.  Driver will have [facility] ID.  Driver expected to arrive in about 20 minutes.

## 2018-11-04 NOTE — Telephone Encounter (Signed)
Provider gave me the FL-2 that is signed and I took it up front for pickup today.  Lillyann Ahart,cma

## 2018-11-05 DIAGNOSIS — M1612 Unilateral primary osteoarthritis, left hip: Secondary | ICD-10-CM | POA: Diagnosis not present

## 2018-11-05 DIAGNOSIS — Z593 Problems related to living in residential institution: Secondary | ICD-10-CM | POA: Diagnosis not present

## 2018-11-05 DIAGNOSIS — E538 Deficiency of other specified B group vitamins: Secondary | ICD-10-CM | POA: Diagnosis not present

## 2018-11-05 DIAGNOSIS — F0390 Unspecified dementia without behavioral disturbance: Secondary | ICD-10-CM | POA: Diagnosis not present

## 2018-11-06 ENCOUNTER — Ambulatory Visit: Payer: Medicare Other

## 2018-11-06 DIAGNOSIS — R279 Unspecified lack of coordination: Secondary | ICD-10-CM | POA: Diagnosis not present

## 2018-11-06 DIAGNOSIS — F0280 Dementia in other diseases classified elsewhere without behavioral disturbance: Secondary | ICD-10-CM | POA: Diagnosis not present

## 2018-11-06 DIAGNOSIS — H8110 Benign paroxysmal vertigo, unspecified ear: Secondary | ICD-10-CM | POA: Diagnosis not present

## 2018-11-06 DIAGNOSIS — M6281 Muscle weakness (generalized): Secondary | ICD-10-CM | POA: Diagnosis not present

## 2018-11-06 DIAGNOSIS — E538 Deficiency of other specified B group vitamins: Secondary | ICD-10-CM | POA: Diagnosis not present

## 2018-11-06 DIAGNOSIS — R41841 Cognitive communication deficit: Secondary | ICD-10-CM | POA: Diagnosis not present

## 2018-11-06 DIAGNOSIS — K219 Gastro-esophageal reflux disease without esophagitis: Secondary | ICD-10-CM | POA: Diagnosis not present

## 2018-11-06 DIAGNOSIS — M1612 Unilateral primary osteoarthritis, left hip: Secondary | ICD-10-CM | POA: Diagnosis not present

## 2018-11-07 DIAGNOSIS — R41841 Cognitive communication deficit: Secondary | ICD-10-CM | POA: Diagnosis not present

## 2018-11-07 DIAGNOSIS — E538 Deficiency of other specified B group vitamins: Secondary | ICD-10-CM | POA: Diagnosis not present

## 2018-11-07 DIAGNOSIS — H8110 Benign paroxysmal vertigo, unspecified ear: Secondary | ICD-10-CM | POA: Diagnosis not present

## 2018-11-07 DIAGNOSIS — R279 Unspecified lack of coordination: Secondary | ICD-10-CM | POA: Diagnosis not present

## 2018-11-07 DIAGNOSIS — F0280 Dementia in other diseases classified elsewhere without behavioral disturbance: Secondary | ICD-10-CM | POA: Diagnosis not present

## 2018-11-07 DIAGNOSIS — M6281 Muscle weakness (generalized): Secondary | ICD-10-CM | POA: Diagnosis not present

## 2018-11-08 DIAGNOSIS — M6281 Muscle weakness (generalized): Secondary | ICD-10-CM | POA: Diagnosis not present

## 2018-11-08 DIAGNOSIS — R279 Unspecified lack of coordination: Secondary | ICD-10-CM | POA: Diagnosis not present

## 2018-11-08 DIAGNOSIS — E538 Deficiency of other specified B group vitamins: Secondary | ICD-10-CM | POA: Diagnosis not present

## 2018-11-08 DIAGNOSIS — F0280 Dementia in other diseases classified elsewhere without behavioral disturbance: Secondary | ICD-10-CM | POA: Diagnosis not present

## 2018-11-08 DIAGNOSIS — R41841 Cognitive communication deficit: Secondary | ICD-10-CM | POA: Diagnosis not present

## 2018-11-08 DIAGNOSIS — H8110 Benign paroxysmal vertigo, unspecified ear: Secondary | ICD-10-CM | POA: Diagnosis not present

## 2018-11-11 DIAGNOSIS — M1612 Unilateral primary osteoarthritis, left hip: Secondary | ICD-10-CM | POA: Diagnosis not present

## 2018-11-11 DIAGNOSIS — M6281 Muscle weakness (generalized): Secondary | ICD-10-CM | POA: Diagnosis not present

## 2018-11-11 DIAGNOSIS — F0280 Dementia in other diseases classified elsewhere without behavioral disturbance: Secondary | ICD-10-CM | POA: Diagnosis not present

## 2018-11-11 DIAGNOSIS — R41841 Cognitive communication deficit: Secondary | ICD-10-CM | POA: Diagnosis not present

## 2018-11-11 DIAGNOSIS — K219 Gastro-esophageal reflux disease without esophagitis: Secondary | ICD-10-CM | POA: Diagnosis not present

## 2018-11-11 DIAGNOSIS — H8110 Benign paroxysmal vertigo, unspecified ear: Secondary | ICD-10-CM | POA: Diagnosis not present

## 2018-11-11 DIAGNOSIS — R279 Unspecified lack of coordination: Secondary | ICD-10-CM | POA: Diagnosis not present

## 2018-11-11 DIAGNOSIS — E538 Deficiency of other specified B group vitamins: Secondary | ICD-10-CM | POA: Diagnosis not present

## 2018-11-12 DIAGNOSIS — F0280 Dementia in other diseases classified elsewhere without behavioral disturbance: Secondary | ICD-10-CM | POA: Diagnosis not present

## 2018-11-12 DIAGNOSIS — E538 Deficiency of other specified B group vitamins: Secondary | ICD-10-CM | POA: Diagnosis not present

## 2018-11-12 DIAGNOSIS — H8110 Benign paroxysmal vertigo, unspecified ear: Secondary | ICD-10-CM | POA: Diagnosis not present

## 2018-11-12 DIAGNOSIS — M6281 Muscle weakness (generalized): Secondary | ICD-10-CM | POA: Diagnosis not present

## 2018-11-12 DIAGNOSIS — R279 Unspecified lack of coordination: Secondary | ICD-10-CM | POA: Diagnosis not present

## 2018-11-12 DIAGNOSIS — R41841 Cognitive communication deficit: Secondary | ICD-10-CM | POA: Diagnosis not present

## 2018-11-13 DIAGNOSIS — M6281 Muscle weakness (generalized): Secondary | ICD-10-CM | POA: Diagnosis not present

## 2018-11-13 DIAGNOSIS — R41841 Cognitive communication deficit: Secondary | ICD-10-CM | POA: Diagnosis not present

## 2018-11-13 DIAGNOSIS — H8110 Benign paroxysmal vertigo, unspecified ear: Secondary | ICD-10-CM | POA: Diagnosis not present

## 2018-11-13 DIAGNOSIS — R279 Unspecified lack of coordination: Secondary | ICD-10-CM | POA: Diagnosis not present

## 2018-11-13 DIAGNOSIS — F0280 Dementia in other diseases classified elsewhere without behavioral disturbance: Secondary | ICD-10-CM | POA: Diagnosis not present

## 2018-11-13 DIAGNOSIS — E538 Deficiency of other specified B group vitamins: Secondary | ICD-10-CM | POA: Diagnosis not present

## 2018-11-14 DIAGNOSIS — R279 Unspecified lack of coordination: Secondary | ICD-10-CM | POA: Diagnosis not present

## 2018-11-14 DIAGNOSIS — H8110 Benign paroxysmal vertigo, unspecified ear: Secondary | ICD-10-CM | POA: Diagnosis not present

## 2018-11-14 DIAGNOSIS — E538 Deficiency of other specified B group vitamins: Secondary | ICD-10-CM | POA: Diagnosis not present

## 2018-11-14 DIAGNOSIS — F0280 Dementia in other diseases classified elsewhere without behavioral disturbance: Secondary | ICD-10-CM | POA: Diagnosis not present

## 2018-11-14 DIAGNOSIS — M6281 Muscle weakness (generalized): Secondary | ICD-10-CM | POA: Diagnosis not present

## 2018-11-14 DIAGNOSIS — R41841 Cognitive communication deficit: Secondary | ICD-10-CM | POA: Diagnosis not present

## 2018-11-15 DIAGNOSIS — R41841 Cognitive communication deficit: Secondary | ICD-10-CM | POA: Diagnosis not present

## 2018-11-15 DIAGNOSIS — F0280 Dementia in other diseases classified elsewhere without behavioral disturbance: Secondary | ICD-10-CM | POA: Diagnosis not present

## 2018-11-15 DIAGNOSIS — R279 Unspecified lack of coordination: Secondary | ICD-10-CM | POA: Diagnosis not present

## 2018-11-15 DIAGNOSIS — M6281 Muscle weakness (generalized): Secondary | ICD-10-CM | POA: Diagnosis not present

## 2018-11-15 DIAGNOSIS — H8110 Benign paroxysmal vertigo, unspecified ear: Secondary | ICD-10-CM | POA: Diagnosis not present

## 2018-11-15 DIAGNOSIS — E538 Deficiency of other specified B group vitamins: Secondary | ICD-10-CM | POA: Diagnosis not present

## 2018-11-18 DIAGNOSIS — R41841 Cognitive communication deficit: Secondary | ICD-10-CM | POA: Diagnosis not present

## 2018-11-18 DIAGNOSIS — H8110 Benign paroxysmal vertigo, unspecified ear: Secondary | ICD-10-CM | POA: Diagnosis not present

## 2018-11-18 DIAGNOSIS — F0280 Dementia in other diseases classified elsewhere without behavioral disturbance: Secondary | ICD-10-CM | POA: Diagnosis not present

## 2018-11-18 DIAGNOSIS — E538 Deficiency of other specified B group vitamins: Secondary | ICD-10-CM | POA: Diagnosis not present

## 2018-11-18 DIAGNOSIS — R279 Unspecified lack of coordination: Secondary | ICD-10-CM | POA: Diagnosis not present

## 2018-11-18 DIAGNOSIS — M6281 Muscle weakness (generalized): Secondary | ICD-10-CM | POA: Diagnosis not present

## 2018-11-19 DIAGNOSIS — R279 Unspecified lack of coordination: Secondary | ICD-10-CM | POA: Diagnosis not present

## 2018-11-19 DIAGNOSIS — M6281 Muscle weakness (generalized): Secondary | ICD-10-CM | POA: Diagnosis not present

## 2018-11-19 DIAGNOSIS — R41841 Cognitive communication deficit: Secondary | ICD-10-CM | POA: Diagnosis not present

## 2018-11-19 DIAGNOSIS — F0280 Dementia in other diseases classified elsewhere without behavioral disturbance: Secondary | ICD-10-CM | POA: Diagnosis not present

## 2018-11-19 DIAGNOSIS — E538 Deficiency of other specified B group vitamins: Secondary | ICD-10-CM | POA: Diagnosis not present

## 2018-11-19 DIAGNOSIS — H8110 Benign paroxysmal vertigo, unspecified ear: Secondary | ICD-10-CM | POA: Diagnosis not present

## 2018-11-20 DIAGNOSIS — E538 Deficiency of other specified B group vitamins: Secondary | ICD-10-CM | POA: Diagnosis not present

## 2018-11-20 DIAGNOSIS — R41841 Cognitive communication deficit: Secondary | ICD-10-CM | POA: Diagnosis not present

## 2018-11-20 DIAGNOSIS — F0280 Dementia in other diseases classified elsewhere without behavioral disturbance: Secondary | ICD-10-CM | POA: Diagnosis not present

## 2018-11-20 DIAGNOSIS — M6281 Muscle weakness (generalized): Secondary | ICD-10-CM | POA: Diagnosis not present

## 2018-11-20 DIAGNOSIS — H8110 Benign paroxysmal vertigo, unspecified ear: Secondary | ICD-10-CM | POA: Diagnosis not present

## 2018-11-20 DIAGNOSIS — R279 Unspecified lack of coordination: Secondary | ICD-10-CM | POA: Diagnosis not present

## 2018-11-21 DIAGNOSIS — E538 Deficiency of other specified B group vitamins: Secondary | ICD-10-CM | POA: Diagnosis not present

## 2018-11-21 DIAGNOSIS — H8110 Benign paroxysmal vertigo, unspecified ear: Secondary | ICD-10-CM | POA: Diagnosis not present

## 2018-11-21 DIAGNOSIS — R279 Unspecified lack of coordination: Secondary | ICD-10-CM | POA: Diagnosis not present

## 2018-11-21 DIAGNOSIS — F0280 Dementia in other diseases classified elsewhere without behavioral disturbance: Secondary | ICD-10-CM | POA: Diagnosis not present

## 2018-11-21 DIAGNOSIS — M6281 Muscle weakness (generalized): Secondary | ICD-10-CM | POA: Diagnosis not present

## 2018-11-21 DIAGNOSIS — R41841 Cognitive communication deficit: Secondary | ICD-10-CM | POA: Diagnosis not present

## 2018-11-22 DIAGNOSIS — R279 Unspecified lack of coordination: Secondary | ICD-10-CM | POA: Diagnosis not present

## 2018-11-22 DIAGNOSIS — R41841 Cognitive communication deficit: Secondary | ICD-10-CM | POA: Diagnosis not present

## 2018-11-22 DIAGNOSIS — E538 Deficiency of other specified B group vitamins: Secondary | ICD-10-CM | POA: Diagnosis not present

## 2018-11-22 DIAGNOSIS — M6281 Muscle weakness (generalized): Secondary | ICD-10-CM | POA: Diagnosis not present

## 2018-11-22 DIAGNOSIS — F0280 Dementia in other diseases classified elsewhere without behavioral disturbance: Secondary | ICD-10-CM | POA: Diagnosis not present

## 2018-11-22 DIAGNOSIS — H8110 Benign paroxysmal vertigo, unspecified ear: Secondary | ICD-10-CM | POA: Diagnosis not present

## 2018-11-25 DIAGNOSIS — R41841 Cognitive communication deficit: Secondary | ICD-10-CM | POA: Diagnosis not present

## 2018-11-25 DIAGNOSIS — H8110 Benign paroxysmal vertigo, unspecified ear: Secondary | ICD-10-CM | POA: Diagnosis not present

## 2018-11-25 DIAGNOSIS — R279 Unspecified lack of coordination: Secondary | ICD-10-CM | POA: Diagnosis not present

## 2018-11-25 DIAGNOSIS — F0280 Dementia in other diseases classified elsewhere without behavioral disturbance: Secondary | ICD-10-CM | POA: Diagnosis not present

## 2018-11-25 DIAGNOSIS — M6281 Muscle weakness (generalized): Secondary | ICD-10-CM | POA: Diagnosis not present

## 2018-11-25 DIAGNOSIS — E538 Deficiency of other specified B group vitamins: Secondary | ICD-10-CM | POA: Diagnosis not present

## 2018-11-26 DIAGNOSIS — E538 Deficiency of other specified B group vitamins: Secondary | ICD-10-CM | POA: Diagnosis not present

## 2018-11-26 DIAGNOSIS — H8110 Benign paroxysmal vertigo, unspecified ear: Secondary | ICD-10-CM | POA: Diagnosis not present

## 2018-11-26 DIAGNOSIS — R41841 Cognitive communication deficit: Secondary | ICD-10-CM | POA: Diagnosis not present

## 2018-11-26 DIAGNOSIS — R279 Unspecified lack of coordination: Secondary | ICD-10-CM | POA: Diagnosis not present

## 2018-11-26 DIAGNOSIS — M6281 Muscle weakness (generalized): Secondary | ICD-10-CM | POA: Diagnosis not present

## 2018-11-26 DIAGNOSIS — F0280 Dementia in other diseases classified elsewhere without behavioral disturbance: Secondary | ICD-10-CM | POA: Diagnosis not present

## 2018-11-27 DIAGNOSIS — R41841 Cognitive communication deficit: Secondary | ICD-10-CM | POA: Diagnosis not present

## 2018-11-27 DIAGNOSIS — H8110 Benign paroxysmal vertigo, unspecified ear: Secondary | ICD-10-CM | POA: Diagnosis not present

## 2018-11-27 DIAGNOSIS — M6281 Muscle weakness (generalized): Secondary | ICD-10-CM | POA: Diagnosis not present

## 2018-11-27 DIAGNOSIS — F0280 Dementia in other diseases classified elsewhere without behavioral disturbance: Secondary | ICD-10-CM | POA: Diagnosis not present

## 2018-11-27 DIAGNOSIS — E538 Deficiency of other specified B group vitamins: Secondary | ICD-10-CM | POA: Diagnosis not present

## 2018-11-27 DIAGNOSIS — R279 Unspecified lack of coordination: Secondary | ICD-10-CM | POA: Diagnosis not present

## 2018-11-28 DIAGNOSIS — R41841 Cognitive communication deficit: Secondary | ICD-10-CM | POA: Diagnosis not present

## 2018-11-28 DIAGNOSIS — R279 Unspecified lack of coordination: Secondary | ICD-10-CM | POA: Diagnosis not present

## 2018-11-28 DIAGNOSIS — E538 Deficiency of other specified B group vitamins: Secondary | ICD-10-CM | POA: Diagnosis not present

## 2018-11-28 DIAGNOSIS — F0280 Dementia in other diseases classified elsewhere without behavioral disturbance: Secondary | ICD-10-CM | POA: Diagnosis not present

## 2018-11-28 DIAGNOSIS — H8110 Benign paroxysmal vertigo, unspecified ear: Secondary | ICD-10-CM | POA: Diagnosis not present

## 2018-11-28 DIAGNOSIS — M6281 Muscle weakness (generalized): Secondary | ICD-10-CM | POA: Diagnosis not present

## 2018-11-29 DIAGNOSIS — M6281 Muscle weakness (generalized): Secondary | ICD-10-CM | POA: Diagnosis not present

## 2018-11-29 DIAGNOSIS — E538 Deficiency of other specified B group vitamins: Secondary | ICD-10-CM | POA: Diagnosis not present

## 2018-11-29 DIAGNOSIS — R41841 Cognitive communication deficit: Secondary | ICD-10-CM | POA: Diagnosis not present

## 2018-11-29 DIAGNOSIS — F0280 Dementia in other diseases classified elsewhere without behavioral disturbance: Secondary | ICD-10-CM | POA: Diagnosis not present

## 2018-11-29 DIAGNOSIS — R279 Unspecified lack of coordination: Secondary | ICD-10-CM | POA: Diagnosis not present

## 2018-11-29 DIAGNOSIS — H8110 Benign paroxysmal vertigo, unspecified ear: Secondary | ICD-10-CM | POA: Diagnosis not present

## 2018-11-29 NOTE — Telephone Encounter (Signed)
err

## 2018-12-02 DIAGNOSIS — R41841 Cognitive communication deficit: Secondary | ICD-10-CM | POA: Diagnosis not present

## 2018-12-02 DIAGNOSIS — R279 Unspecified lack of coordination: Secondary | ICD-10-CM | POA: Diagnosis not present

## 2018-12-02 DIAGNOSIS — E538 Deficiency of other specified B group vitamins: Secondary | ICD-10-CM | POA: Diagnosis not present

## 2018-12-02 DIAGNOSIS — M6281 Muscle weakness (generalized): Secondary | ICD-10-CM | POA: Diagnosis not present

## 2018-12-02 DIAGNOSIS — H8110 Benign paroxysmal vertigo, unspecified ear: Secondary | ICD-10-CM | POA: Diagnosis not present

## 2018-12-02 DIAGNOSIS — F0280 Dementia in other diseases classified elsewhere without behavioral disturbance: Secondary | ICD-10-CM | POA: Diagnosis not present

## 2018-12-03 DIAGNOSIS — E538 Deficiency of other specified B group vitamins: Secondary | ICD-10-CM | POA: Diagnosis not present

## 2018-12-03 DIAGNOSIS — H8110 Benign paroxysmal vertigo, unspecified ear: Secondary | ICD-10-CM | POA: Diagnosis not present

## 2018-12-03 DIAGNOSIS — R279 Unspecified lack of coordination: Secondary | ICD-10-CM | POA: Diagnosis not present

## 2018-12-03 DIAGNOSIS — R41841 Cognitive communication deficit: Secondary | ICD-10-CM | POA: Diagnosis not present

## 2018-12-03 DIAGNOSIS — M6281 Muscle weakness (generalized): Secondary | ICD-10-CM | POA: Diagnosis not present

## 2018-12-03 DIAGNOSIS — F0280 Dementia in other diseases classified elsewhere without behavioral disturbance: Secondary | ICD-10-CM | POA: Diagnosis not present

## 2018-12-04 DIAGNOSIS — E538 Deficiency of other specified B group vitamins: Secondary | ICD-10-CM | POA: Diagnosis not present

## 2018-12-04 DIAGNOSIS — H8110 Benign paroxysmal vertigo, unspecified ear: Secondary | ICD-10-CM | POA: Diagnosis not present

## 2018-12-04 DIAGNOSIS — R279 Unspecified lack of coordination: Secondary | ICD-10-CM | POA: Diagnosis not present

## 2018-12-04 DIAGNOSIS — M6281 Muscle weakness (generalized): Secondary | ICD-10-CM | POA: Diagnosis not present

## 2018-12-04 DIAGNOSIS — F0280 Dementia in other diseases classified elsewhere without behavioral disturbance: Secondary | ICD-10-CM | POA: Diagnosis not present

## 2018-12-04 DIAGNOSIS — R41841 Cognitive communication deficit: Secondary | ICD-10-CM | POA: Diagnosis not present

## 2018-12-05 DIAGNOSIS — R279 Unspecified lack of coordination: Secondary | ICD-10-CM | POA: Diagnosis not present

## 2018-12-05 DIAGNOSIS — M6281 Muscle weakness (generalized): Secondary | ICD-10-CM | POA: Diagnosis not present

## 2018-12-05 DIAGNOSIS — F0280 Dementia in other diseases classified elsewhere without behavioral disturbance: Secondary | ICD-10-CM | POA: Diagnosis not present

## 2018-12-05 DIAGNOSIS — E538 Deficiency of other specified B group vitamins: Secondary | ICD-10-CM | POA: Diagnosis not present

## 2018-12-05 DIAGNOSIS — H8110 Benign paroxysmal vertigo, unspecified ear: Secondary | ICD-10-CM | POA: Diagnosis not present

## 2018-12-05 DIAGNOSIS — R41841 Cognitive communication deficit: Secondary | ICD-10-CM | POA: Diagnosis not present

## 2018-12-06 DIAGNOSIS — H8110 Benign paroxysmal vertigo, unspecified ear: Secondary | ICD-10-CM | POA: Diagnosis not present

## 2018-12-06 DIAGNOSIS — R41841 Cognitive communication deficit: Secondary | ICD-10-CM | POA: Diagnosis not present

## 2018-12-06 DIAGNOSIS — E538 Deficiency of other specified B group vitamins: Secondary | ICD-10-CM | POA: Diagnosis not present

## 2018-12-06 DIAGNOSIS — F0280 Dementia in other diseases classified elsewhere without behavioral disturbance: Secondary | ICD-10-CM | POA: Diagnosis not present

## 2018-12-06 DIAGNOSIS — M6281 Muscle weakness (generalized): Secondary | ICD-10-CM | POA: Diagnosis not present

## 2018-12-06 DIAGNOSIS — R279 Unspecified lack of coordination: Secondary | ICD-10-CM | POA: Diagnosis not present

## 2018-12-09 DIAGNOSIS — E538 Deficiency of other specified B group vitamins: Secondary | ICD-10-CM | POA: Diagnosis not present

## 2018-12-09 DIAGNOSIS — R279 Unspecified lack of coordination: Secondary | ICD-10-CM | POA: Diagnosis not present

## 2018-12-09 DIAGNOSIS — F0280 Dementia in other diseases classified elsewhere without behavioral disturbance: Secondary | ICD-10-CM | POA: Diagnosis not present

## 2018-12-09 DIAGNOSIS — R41841 Cognitive communication deficit: Secondary | ICD-10-CM | POA: Diagnosis not present

## 2018-12-09 DIAGNOSIS — M6281 Muscle weakness (generalized): Secondary | ICD-10-CM | POA: Diagnosis not present

## 2018-12-09 DIAGNOSIS — H8110 Benign paroxysmal vertigo, unspecified ear: Secondary | ICD-10-CM | POA: Diagnosis not present

## 2018-12-10 ENCOUNTER — Encounter
Admission: RE | Admit: 2018-12-10 | Discharge: 2018-12-10 | Disposition: A | Discharge status: Home / Self Care | Payer: Medicare Other | Source: Ambulatory Visit | Attending: Internal Medicine | Admitting: Internal Medicine

## 2018-12-10 DIAGNOSIS — R41841 Cognitive communication deficit: Secondary | ICD-10-CM | POA: Diagnosis not present

## 2018-12-10 DIAGNOSIS — M6281 Muscle weakness (generalized): Secondary | ICD-10-CM | POA: Diagnosis not present

## 2018-12-10 DIAGNOSIS — F0280 Dementia in other diseases classified elsewhere without behavioral disturbance: Secondary | ICD-10-CM | POA: Diagnosis not present

## 2018-12-10 DIAGNOSIS — H8110 Benign paroxysmal vertigo, unspecified ear: Secondary | ICD-10-CM | POA: Diagnosis not present

## 2018-12-10 DIAGNOSIS — K219 Gastro-esophageal reflux disease without esophagitis: Secondary | ICD-10-CM | POA: Diagnosis not present

## 2018-12-10 DIAGNOSIS — R279 Unspecified lack of coordination: Secondary | ICD-10-CM | POA: Diagnosis not present

## 2018-12-10 DIAGNOSIS — M1612 Unilateral primary osteoarthritis, left hip: Secondary | ICD-10-CM | POA: Diagnosis not present

## 2018-12-10 DIAGNOSIS — E538 Deficiency of other specified B group vitamins: Secondary | ICD-10-CM | POA: Diagnosis not present

## 2018-12-11 DIAGNOSIS — R41841 Cognitive communication deficit: Secondary | ICD-10-CM | POA: Diagnosis not present

## 2018-12-11 DIAGNOSIS — R279 Unspecified lack of coordination: Secondary | ICD-10-CM | POA: Diagnosis not present

## 2018-12-11 DIAGNOSIS — M6281 Muscle weakness (generalized): Secondary | ICD-10-CM | POA: Diagnosis not present

## 2018-12-11 DIAGNOSIS — H8110 Benign paroxysmal vertigo, unspecified ear: Secondary | ICD-10-CM | POA: Diagnosis not present

## 2018-12-11 DIAGNOSIS — F0280 Dementia in other diseases classified elsewhere without behavioral disturbance: Secondary | ICD-10-CM | POA: Diagnosis not present

## 2018-12-11 DIAGNOSIS — E538 Deficiency of other specified B group vitamins: Secondary | ICD-10-CM | POA: Diagnosis not present

## 2018-12-12 DIAGNOSIS — E538 Deficiency of other specified B group vitamins: Secondary | ICD-10-CM | POA: Diagnosis not present

## 2018-12-12 DIAGNOSIS — H8110 Benign paroxysmal vertigo, unspecified ear: Secondary | ICD-10-CM | POA: Diagnosis not present

## 2018-12-12 DIAGNOSIS — M6281 Muscle weakness (generalized): Secondary | ICD-10-CM | POA: Diagnosis not present

## 2018-12-12 DIAGNOSIS — R41841 Cognitive communication deficit: Secondary | ICD-10-CM | POA: Diagnosis not present

## 2018-12-12 DIAGNOSIS — F0280 Dementia in other diseases classified elsewhere without behavioral disturbance: Secondary | ICD-10-CM | POA: Diagnosis not present

## 2018-12-12 DIAGNOSIS — R279 Unspecified lack of coordination: Secondary | ICD-10-CM | POA: Diagnosis not present

## 2018-12-13 DIAGNOSIS — R801 Persistent proteinuria, unspecified: Secondary | ICD-10-CM | POA: Diagnosis not present

## 2018-12-13 DIAGNOSIS — R41841 Cognitive communication deficit: Secondary | ICD-10-CM | POA: Diagnosis not present

## 2018-12-13 DIAGNOSIS — E538 Deficiency of other specified B group vitamins: Secondary | ICD-10-CM | POA: Diagnosis not present

## 2018-12-13 DIAGNOSIS — H8110 Benign paroxysmal vertigo, unspecified ear: Secondary | ICD-10-CM | POA: Diagnosis not present

## 2018-12-13 DIAGNOSIS — F0280 Dementia in other diseases classified elsewhere without behavioral disturbance: Secondary | ICD-10-CM | POA: Diagnosis not present

## 2018-12-13 DIAGNOSIS — R3129 Other microscopic hematuria: Secondary | ICD-10-CM | POA: Diagnosis not present

## 2018-12-13 DIAGNOSIS — R279 Unspecified lack of coordination: Secondary | ICD-10-CM | POA: Diagnosis not present

## 2018-12-13 DIAGNOSIS — M6281 Muscle weakness (generalized): Secondary | ICD-10-CM | POA: Diagnosis not present

## 2018-12-16 DIAGNOSIS — F0280 Dementia in other diseases classified elsewhere without behavioral disturbance: Secondary | ICD-10-CM | POA: Diagnosis not present

## 2018-12-16 DIAGNOSIS — R279 Unspecified lack of coordination: Secondary | ICD-10-CM | POA: Diagnosis not present

## 2018-12-16 DIAGNOSIS — E538 Deficiency of other specified B group vitamins: Secondary | ICD-10-CM | POA: Diagnosis not present

## 2018-12-16 DIAGNOSIS — R41841 Cognitive communication deficit: Secondary | ICD-10-CM | POA: Diagnosis not present

## 2018-12-16 DIAGNOSIS — M6281 Muscle weakness (generalized): Secondary | ICD-10-CM | POA: Diagnosis not present

## 2018-12-16 DIAGNOSIS — H8110 Benign paroxysmal vertigo, unspecified ear: Secondary | ICD-10-CM | POA: Diagnosis not present

## 2018-12-17 DIAGNOSIS — F0280 Dementia in other diseases classified elsewhere without behavioral disturbance: Secondary | ICD-10-CM | POA: Diagnosis not present

## 2018-12-17 DIAGNOSIS — E538 Deficiency of other specified B group vitamins: Secondary | ICD-10-CM | POA: Diagnosis not present

## 2018-12-17 DIAGNOSIS — R41841 Cognitive communication deficit: Secondary | ICD-10-CM | POA: Diagnosis not present

## 2018-12-17 DIAGNOSIS — M6281 Muscle weakness (generalized): Secondary | ICD-10-CM | POA: Diagnosis not present

## 2018-12-17 DIAGNOSIS — R279 Unspecified lack of coordination: Secondary | ICD-10-CM | POA: Diagnosis not present

## 2018-12-17 DIAGNOSIS — H8110 Benign paroxysmal vertigo, unspecified ear: Secondary | ICD-10-CM | POA: Diagnosis not present

## 2018-12-18 ENCOUNTER — Other Ambulatory Visit
Admission: RE | Admit: 2018-12-18 | Discharge: 2018-12-18 | Disposition: A | Discharge status: Home / Self Care | Payer: Medicare Other | Source: Ambulatory Visit | Attending: Internal Medicine | Admitting: Internal Medicine

## 2018-12-18 DIAGNOSIS — R801 Persistent proteinuria, unspecified: Secondary | ICD-10-CM | POA: Diagnosis not present

## 2018-12-18 LAB — RENAL FUNCTION PANEL
Albumin: 3.6 g/dL (ref 3.5–5.0)
Anion gap: 9 (ref 5–15)
BUN: 26 mg/dL — ABNORMAL HIGH (ref 8–23)
CO2: 25 mmol/L (ref 22–32)
Calcium: 9.1 mg/dL (ref 8.9–10.3)
Chloride: 104 mmol/L (ref 98–111)
Creatinine, Ser: 0.72 mg/dL (ref 0.44–1.00)
GFR calc Af Amer: >60 mL/min (ref >60)
GFR calc non Af Amer: >60 mL/min (ref >60)
Glucose, Bld: 86 mg/dL (ref 70–99)
Phosphorus: 3.7 mg/dL (ref 2.5–4.6)
Potassium: 4 mmol/L (ref 3.5–5.1)
Sodium: 138 mmol/L (ref 135–145)

## 2018-12-18 LAB — CBC WITH DIFFERENTIAL/PLATELET
Abs Immature Granulocytes: 0.02 10*3/uL (ref 0.00–0.07)
Basophils Absolute: 0 10*3/uL (ref 0.0–0.1)
Basophils Relative: 1 %
Eosinophils Absolute: 0.1 10*3/uL (ref 0.0–0.5)
Eosinophils Relative: 2 %
HCT: 38.7 % (ref 36.0–46.0)
Hemoglobin: 12.5 g/dL (ref 12.0–15.0)
Immature Granulocytes: 0 %
Lymphocytes Relative: 38 %
Lymphs Abs: 2.1 10*3/uL (ref 0.7–4.0)
MCH: 29.1 pg (ref 26.0–34.0)
MCHC: 32.3 g/dL (ref 30.0–36.0)
MCV: 90 fL (ref 80.0–100.0)
Monocytes Absolute: 0.5 10*3/uL (ref 0.1–1.0)
Monocytes Relative: 9 %
Neutro Abs: 2.8 10*3/uL (ref 1.7–7.7)
Neutrophils Relative %: 50 %
Platelets: 223 10*3/uL (ref 150–400)
RBC: 4.3 MIL/uL (ref 3.87–5.11)
RDW: 12.1 % (ref 11.5–15.5)
WBC: 5.5 10*3/uL (ref 4.0–10.5)
nRBC: 0 % (ref 0.0–0.2)

## 2018-12-19 ENCOUNTER — Other Ambulatory Visit
Admission: RE | Admit: 2018-12-19 | Discharge: 2018-12-19 | Disposition: A | Discharge status: Home / Self Care | Payer: Medicare Other | Source: Skilled Nursing Facility | Attending: Internal Medicine | Admitting: Internal Medicine

## 2018-12-19 DIAGNOSIS — R279 Unspecified lack of coordination: Secondary | ICD-10-CM | POA: Diagnosis not present

## 2018-12-19 DIAGNOSIS — H8110 Benign paroxysmal vertigo, unspecified ear: Secondary | ICD-10-CM | POA: Diagnosis not present

## 2018-12-19 DIAGNOSIS — M6281 Muscle weakness (generalized): Secondary | ICD-10-CM | POA: Diagnosis not present

## 2018-12-19 DIAGNOSIS — E538 Deficiency of other specified B group vitamins: Secondary | ICD-10-CM | POA: Diagnosis not present

## 2018-12-19 DIAGNOSIS — N189 Chronic kidney disease, unspecified: Secondary | ICD-10-CM | POA: Insufficient documentation

## 2018-12-19 DIAGNOSIS — N390 Urinary tract infection, site not specified: Secondary | ICD-10-CM | POA: Insufficient documentation

## 2018-12-19 DIAGNOSIS — R41841 Cognitive communication deficit: Secondary | ICD-10-CM | POA: Diagnosis not present

## 2018-12-19 DIAGNOSIS — F0280 Dementia in other diseases classified elsewhere without behavioral disturbance: Secondary | ICD-10-CM | POA: Diagnosis not present

## 2018-12-19 LAB — PROTEIN / CREATININE RATIO, URINE
Creatinine, Urine: 121 mg/dL
Protein Creatinine Ratio: 0.12 mg/mg{Cre} (ref 0.00–0.15)
Total Protein, Urine: 14 mg/dL

## 2018-12-19 LAB — URINALYSIS, COMPLETE (UACMP) WITH MICROSCOPIC
Bilirubin Urine: NEGATIVE
Glucose, UA: NEGATIVE mg/dL
Ketones, ur: NEGATIVE mg/dL
Nitrite: NEGATIVE
Protein, ur: NEGATIVE mg/dL
Specific Gravity, Urine: 1.021 (ref 1.005–1.030)
pH: 5 (ref 5.0–8.0)

## 2018-12-19 LAB — PROTEIN ELECTROPHORESIS, SERUM
A/G Ratio: 1.3 (ref 0.7–1.7)
Albumin ELP: 3.4 g/dL (ref 2.9–4.4)
Alpha-1-Globulin: 0.2 g/dL (ref 0.0–0.4)
Alpha-2-Globulin: 0.9 g/dL (ref 0.4–1.0)
Beta Globulin: 0.8 g/dL (ref 0.7–1.3)
Gamma Globulin: 0.7 g/dL (ref 0.4–1.8)
Globulin, Total: 2.6 g/dL (ref 2.2–3.9)
Total Protein ELP: 6 g/dL (ref 6.0–8.5)

## 2018-12-20 DIAGNOSIS — R41841 Cognitive communication deficit: Secondary | ICD-10-CM | POA: Diagnosis not present

## 2018-12-20 DIAGNOSIS — F0280 Dementia in other diseases classified elsewhere without behavioral disturbance: Secondary | ICD-10-CM | POA: Diagnosis not present

## 2018-12-20 DIAGNOSIS — H8110 Benign paroxysmal vertigo, unspecified ear: Secondary | ICD-10-CM | POA: Diagnosis not present

## 2018-12-20 DIAGNOSIS — M6281 Muscle weakness (generalized): Secondary | ICD-10-CM | POA: Diagnosis not present

## 2018-12-20 DIAGNOSIS — R279 Unspecified lack of coordination: Secondary | ICD-10-CM | POA: Diagnosis not present

## 2018-12-20 DIAGNOSIS — E538 Deficiency of other specified B group vitamins: Secondary | ICD-10-CM | POA: Diagnosis not present

## 2018-12-20 LAB — MICROALBUMIN / CREATININE URINE RATIO
Creatinine, Urine: 103.5 mg/dL
Microalb Creat Ratio: 7 mg/g creat (ref 0–29)
Microalb, Ur: 6.8 ug/mL — ABNORMAL HIGH

## 2018-12-21 LAB — URINE CULTURE

## 2018-12-23 DIAGNOSIS — H8110 Benign paroxysmal vertigo, unspecified ear: Secondary | ICD-10-CM | POA: Diagnosis not present

## 2018-12-23 DIAGNOSIS — R279 Unspecified lack of coordination: Secondary | ICD-10-CM | POA: Diagnosis not present

## 2018-12-23 DIAGNOSIS — R41841 Cognitive communication deficit: Secondary | ICD-10-CM | POA: Diagnosis not present

## 2018-12-23 DIAGNOSIS — M6281 Muscle weakness (generalized): Secondary | ICD-10-CM | POA: Diagnosis not present

## 2018-12-23 DIAGNOSIS — F0280 Dementia in other diseases classified elsewhere without behavioral disturbance: Secondary | ICD-10-CM | POA: Diagnosis not present

## 2018-12-23 DIAGNOSIS — E538 Deficiency of other specified B group vitamins: Secondary | ICD-10-CM | POA: Diagnosis not present

## 2018-12-24 DIAGNOSIS — M6281 Muscle weakness (generalized): Secondary | ICD-10-CM | POA: Diagnosis not present

## 2018-12-24 DIAGNOSIS — E538 Deficiency of other specified B group vitamins: Secondary | ICD-10-CM | POA: Diagnosis not present

## 2018-12-24 DIAGNOSIS — R279 Unspecified lack of coordination: Secondary | ICD-10-CM | POA: Diagnosis not present

## 2018-12-24 DIAGNOSIS — H8110 Benign paroxysmal vertigo, unspecified ear: Secondary | ICD-10-CM | POA: Diagnosis not present

## 2018-12-24 DIAGNOSIS — F0280 Dementia in other diseases classified elsewhere without behavioral disturbance: Secondary | ICD-10-CM | POA: Diagnosis not present

## 2018-12-24 DIAGNOSIS — R41841 Cognitive communication deficit: Secondary | ICD-10-CM | POA: Diagnosis not present

## 2018-12-25 ENCOUNTER — Ambulatory Visit: Payer: Medicare Other | Admitting: Family Medicine

## 2018-12-25 DIAGNOSIS — E538 Deficiency of other specified B group vitamins: Secondary | ICD-10-CM | POA: Diagnosis not present

## 2018-12-25 DIAGNOSIS — M6281 Muscle weakness (generalized): Secondary | ICD-10-CM | POA: Diagnosis not present

## 2018-12-25 DIAGNOSIS — R3129 Other microscopic hematuria: Secondary | ICD-10-CM | POA: Diagnosis not present

## 2018-12-25 DIAGNOSIS — F0280 Dementia in other diseases classified elsewhere without behavioral disturbance: Secondary | ICD-10-CM | POA: Diagnosis not present

## 2018-12-25 DIAGNOSIS — H8110 Benign paroxysmal vertigo, unspecified ear: Secondary | ICD-10-CM | POA: Diagnosis not present

## 2018-12-25 DIAGNOSIS — R279 Unspecified lack of coordination: Secondary | ICD-10-CM | POA: Diagnosis not present

## 2018-12-25 DIAGNOSIS — R41841 Cognitive communication deficit: Secondary | ICD-10-CM | POA: Diagnosis not present

## 2018-12-26 DIAGNOSIS — R279 Unspecified lack of coordination: Secondary | ICD-10-CM | POA: Diagnosis not present

## 2018-12-26 DIAGNOSIS — E538 Deficiency of other specified B group vitamins: Secondary | ICD-10-CM | POA: Diagnosis not present

## 2018-12-26 DIAGNOSIS — H8110 Benign paroxysmal vertigo, unspecified ear: Secondary | ICD-10-CM | POA: Diagnosis not present

## 2018-12-26 DIAGNOSIS — M6281 Muscle weakness (generalized): Secondary | ICD-10-CM | POA: Diagnosis not present

## 2018-12-26 DIAGNOSIS — R41841 Cognitive communication deficit: Secondary | ICD-10-CM | POA: Diagnosis not present

## 2018-12-26 DIAGNOSIS — F0280 Dementia in other diseases classified elsewhere without behavioral disturbance: Secondary | ICD-10-CM | POA: Diagnosis not present

## 2018-12-27 DIAGNOSIS — M6281 Muscle weakness (generalized): Secondary | ICD-10-CM | POA: Diagnosis not present

## 2018-12-27 DIAGNOSIS — E538 Deficiency of other specified B group vitamins: Secondary | ICD-10-CM | POA: Diagnosis not present

## 2018-12-27 DIAGNOSIS — F0280 Dementia in other diseases classified elsewhere without behavioral disturbance: Secondary | ICD-10-CM | POA: Diagnosis not present

## 2018-12-27 DIAGNOSIS — H8110 Benign paroxysmal vertigo, unspecified ear: Secondary | ICD-10-CM | POA: Diagnosis not present

## 2018-12-27 DIAGNOSIS — R279 Unspecified lack of coordination: Secondary | ICD-10-CM | POA: Diagnosis not present

## 2018-12-27 DIAGNOSIS — R41841 Cognitive communication deficit: Secondary | ICD-10-CM | POA: Diagnosis not present

## 2018-12-31 DIAGNOSIS — H8110 Benign paroxysmal vertigo, unspecified ear: Secondary | ICD-10-CM | POA: Diagnosis not present

## 2018-12-31 DIAGNOSIS — F0280 Dementia in other diseases classified elsewhere without behavioral disturbance: Secondary | ICD-10-CM | POA: Diagnosis not present

## 2018-12-31 DIAGNOSIS — R41841 Cognitive communication deficit: Secondary | ICD-10-CM | POA: Diagnosis not present

## 2018-12-31 DIAGNOSIS — E538 Deficiency of other specified B group vitamins: Secondary | ICD-10-CM | POA: Diagnosis not present

## 2018-12-31 DIAGNOSIS — R279 Unspecified lack of coordination: Secondary | ICD-10-CM | POA: Diagnosis not present

## 2018-12-31 DIAGNOSIS — M6281 Muscle weakness (generalized): Secondary | ICD-10-CM | POA: Diagnosis not present

## 2019-01-01 DIAGNOSIS — R41841 Cognitive communication deficit: Secondary | ICD-10-CM | POA: Diagnosis not present

## 2019-01-01 DIAGNOSIS — R279 Unspecified lack of coordination: Secondary | ICD-10-CM | POA: Diagnosis not present

## 2019-01-01 DIAGNOSIS — F0280 Dementia in other diseases classified elsewhere without behavioral disturbance: Secondary | ICD-10-CM | POA: Diagnosis not present

## 2019-01-01 DIAGNOSIS — E538 Deficiency of other specified B group vitamins: Secondary | ICD-10-CM | POA: Diagnosis not present

## 2019-01-01 DIAGNOSIS — M6281 Muscle weakness (generalized): Secondary | ICD-10-CM | POA: Diagnosis not present

## 2019-01-01 DIAGNOSIS — H8110 Benign paroxysmal vertigo, unspecified ear: Secondary | ICD-10-CM | POA: Diagnosis not present

## 2019-01-03 DIAGNOSIS — E538 Deficiency of other specified B group vitamins: Secondary | ICD-10-CM | POA: Diagnosis not present

## 2019-01-03 DIAGNOSIS — R41841 Cognitive communication deficit: Secondary | ICD-10-CM | POA: Diagnosis not present

## 2019-01-03 DIAGNOSIS — F0280 Dementia in other diseases classified elsewhere without behavioral disturbance: Secondary | ICD-10-CM | POA: Diagnosis not present

## 2019-01-03 DIAGNOSIS — M6281 Muscle weakness (generalized): Secondary | ICD-10-CM | POA: Diagnosis not present

## 2019-01-03 DIAGNOSIS — H8110 Benign paroxysmal vertigo, unspecified ear: Secondary | ICD-10-CM | POA: Diagnosis not present

## 2019-01-03 DIAGNOSIS — R279 Unspecified lack of coordination: Secondary | ICD-10-CM | POA: Diagnosis not present

## 2019-01-06 DIAGNOSIS — F0280 Dementia in other diseases classified elsewhere without behavioral disturbance: Secondary | ICD-10-CM | POA: Diagnosis not present

## 2019-01-06 DIAGNOSIS — E538 Deficiency of other specified B group vitamins: Secondary | ICD-10-CM | POA: Diagnosis not present

## 2019-01-06 DIAGNOSIS — M6281 Muscle weakness (generalized): Secondary | ICD-10-CM | POA: Diagnosis not present

## 2019-01-06 DIAGNOSIS — H8110 Benign paroxysmal vertigo, unspecified ear: Secondary | ICD-10-CM | POA: Diagnosis not present

## 2019-01-06 DIAGNOSIS — R41841 Cognitive communication deficit: Secondary | ICD-10-CM | POA: Diagnosis not present

## 2019-01-06 DIAGNOSIS — R279 Unspecified lack of coordination: Secondary | ICD-10-CM | POA: Diagnosis not present

## 2019-01-07 DIAGNOSIS — H8110 Benign paroxysmal vertigo, unspecified ear: Secondary | ICD-10-CM | POA: Diagnosis not present

## 2019-01-07 DIAGNOSIS — F0280 Dementia in other diseases classified elsewhere without behavioral disturbance: Secondary | ICD-10-CM | POA: Diagnosis not present

## 2019-01-07 DIAGNOSIS — R279 Unspecified lack of coordination: Secondary | ICD-10-CM | POA: Diagnosis not present

## 2019-01-07 DIAGNOSIS — R41841 Cognitive communication deficit: Secondary | ICD-10-CM | POA: Diagnosis not present

## 2019-01-07 DIAGNOSIS — E538 Deficiency of other specified B group vitamins: Secondary | ICD-10-CM | POA: Diagnosis not present

## 2019-01-07 DIAGNOSIS — M6281 Muscle weakness (generalized): Secondary | ICD-10-CM | POA: Diagnosis not present

## 2019-01-08 DIAGNOSIS — R41841 Cognitive communication deficit: Secondary | ICD-10-CM | POA: Diagnosis not present

## 2019-01-08 DIAGNOSIS — F0280 Dementia in other diseases classified elsewhere without behavioral disturbance: Secondary | ICD-10-CM | POA: Diagnosis not present

## 2019-01-08 DIAGNOSIS — H8110 Benign paroxysmal vertigo, unspecified ear: Secondary | ICD-10-CM | POA: Diagnosis not present

## 2019-01-08 DIAGNOSIS — E538 Deficiency of other specified B group vitamins: Secondary | ICD-10-CM | POA: Diagnosis not present

## 2019-01-08 DIAGNOSIS — M6281 Muscle weakness (generalized): Secondary | ICD-10-CM | POA: Diagnosis not present

## 2019-01-08 DIAGNOSIS — R279 Unspecified lack of coordination: Secondary | ICD-10-CM | POA: Diagnosis not present

## 2019-01-09 DIAGNOSIS — R41841 Cognitive communication deficit: Secondary | ICD-10-CM | POA: Diagnosis not present

## 2019-01-09 DIAGNOSIS — M6281 Muscle weakness (generalized): Secondary | ICD-10-CM | POA: Diagnosis not present

## 2019-01-09 DIAGNOSIS — H8110 Benign paroxysmal vertigo, unspecified ear: Secondary | ICD-10-CM | POA: Diagnosis not present

## 2019-01-09 DIAGNOSIS — R279 Unspecified lack of coordination: Secondary | ICD-10-CM | POA: Diagnosis not present

## 2019-01-09 DIAGNOSIS — F0280 Dementia in other diseases classified elsewhere without behavioral disturbance: Secondary | ICD-10-CM | POA: Diagnosis not present

## 2019-01-09 DIAGNOSIS — E538 Deficiency of other specified B group vitamins: Secondary | ICD-10-CM | POA: Diagnosis not present

## 2019-01-09 DIAGNOSIS — M1612 Unilateral primary osteoarthritis, left hip: Secondary | ICD-10-CM | POA: Diagnosis not present

## 2019-01-09 DIAGNOSIS — K219 Gastro-esophageal reflux disease without esophagitis: Secondary | ICD-10-CM | POA: Diagnosis not present

## 2019-01-10 ENCOUNTER — Encounter
Admission: RE | Admit: 2019-01-10 | Discharge: 2019-01-10 | Disposition: A | Discharge status: Home / Self Care | Payer: Medicare Other | Source: Ambulatory Visit | Attending: Internal Medicine | Admitting: Internal Medicine

## 2019-01-10 DIAGNOSIS — H8110 Benign paroxysmal vertigo, unspecified ear: Secondary | ICD-10-CM | POA: Diagnosis not present

## 2019-01-10 DIAGNOSIS — E538 Deficiency of other specified B group vitamins: Secondary | ICD-10-CM | POA: Diagnosis not present

## 2019-01-10 DIAGNOSIS — R41841 Cognitive communication deficit: Secondary | ICD-10-CM | POA: Diagnosis not present

## 2019-01-10 DIAGNOSIS — M6281 Muscle weakness (generalized): Secondary | ICD-10-CM | POA: Diagnosis not present

## 2019-01-10 DIAGNOSIS — R279 Unspecified lack of coordination: Secondary | ICD-10-CM | POA: Diagnosis not present

## 2019-01-10 DIAGNOSIS — F0280 Dementia in other diseases classified elsewhere without behavioral disturbance: Secondary | ICD-10-CM | POA: Diagnosis not present

## 2019-01-13 DIAGNOSIS — E538 Deficiency of other specified B group vitamins: Secondary | ICD-10-CM | POA: Diagnosis not present

## 2019-01-13 DIAGNOSIS — R41841 Cognitive communication deficit: Secondary | ICD-10-CM | POA: Diagnosis not present

## 2019-01-13 DIAGNOSIS — F0280 Dementia in other diseases classified elsewhere without behavioral disturbance: Secondary | ICD-10-CM | POA: Diagnosis not present

## 2019-01-13 DIAGNOSIS — M6281 Muscle weakness (generalized): Secondary | ICD-10-CM | POA: Diagnosis not present

## 2019-01-13 DIAGNOSIS — H8110 Benign paroxysmal vertigo, unspecified ear: Secondary | ICD-10-CM | POA: Diagnosis not present

## 2019-01-13 DIAGNOSIS — R279 Unspecified lack of coordination: Secondary | ICD-10-CM | POA: Diagnosis not present

## 2019-01-14 DIAGNOSIS — F0280 Dementia in other diseases classified elsewhere without behavioral disturbance: Secondary | ICD-10-CM | POA: Diagnosis not present

## 2019-01-14 DIAGNOSIS — E538 Deficiency of other specified B group vitamins: Secondary | ICD-10-CM | POA: Diagnosis not present

## 2019-01-14 DIAGNOSIS — R279 Unspecified lack of coordination: Secondary | ICD-10-CM | POA: Diagnosis not present

## 2019-01-14 DIAGNOSIS — R41841 Cognitive communication deficit: Secondary | ICD-10-CM | POA: Diagnosis not present

## 2019-01-14 DIAGNOSIS — H8110 Benign paroxysmal vertigo, unspecified ear: Secondary | ICD-10-CM | POA: Diagnosis not present

## 2019-01-14 DIAGNOSIS — M6281 Muscle weakness (generalized): Secondary | ICD-10-CM | POA: Diagnosis not present

## 2019-01-15 DIAGNOSIS — R279 Unspecified lack of coordination: Secondary | ICD-10-CM | POA: Diagnosis not present

## 2019-01-15 DIAGNOSIS — E538 Deficiency of other specified B group vitamins: Secondary | ICD-10-CM | POA: Diagnosis not present

## 2019-01-15 DIAGNOSIS — H8110 Benign paroxysmal vertigo, unspecified ear: Secondary | ICD-10-CM | POA: Diagnosis not present

## 2019-01-15 DIAGNOSIS — F0280 Dementia in other diseases classified elsewhere without behavioral disturbance: Secondary | ICD-10-CM | POA: Diagnosis not present

## 2019-01-15 DIAGNOSIS — R41841 Cognitive communication deficit: Secondary | ICD-10-CM | POA: Diagnosis not present

## 2019-01-15 DIAGNOSIS — M6281 Muscle weakness (generalized): Secondary | ICD-10-CM | POA: Diagnosis not present

## 2019-01-16 DIAGNOSIS — H8110 Benign paroxysmal vertigo, unspecified ear: Secondary | ICD-10-CM | POA: Diagnosis not present

## 2019-01-16 DIAGNOSIS — R279 Unspecified lack of coordination: Secondary | ICD-10-CM | POA: Diagnosis not present

## 2019-01-16 DIAGNOSIS — R41841 Cognitive communication deficit: Secondary | ICD-10-CM | POA: Diagnosis not present

## 2019-01-16 DIAGNOSIS — F0280 Dementia in other diseases classified elsewhere without behavioral disturbance: Secondary | ICD-10-CM | POA: Diagnosis not present

## 2019-01-16 DIAGNOSIS — M6281 Muscle weakness (generalized): Secondary | ICD-10-CM | POA: Diagnosis not present

## 2019-01-16 DIAGNOSIS — E538 Deficiency of other specified B group vitamins: Secondary | ICD-10-CM | POA: Diagnosis not present

## 2019-01-17 DIAGNOSIS — R279 Unspecified lack of coordination: Secondary | ICD-10-CM | POA: Diagnosis not present

## 2019-01-17 DIAGNOSIS — E538 Deficiency of other specified B group vitamins: Secondary | ICD-10-CM | POA: Diagnosis not present

## 2019-01-17 DIAGNOSIS — H8110 Benign paroxysmal vertigo, unspecified ear: Secondary | ICD-10-CM | POA: Diagnosis not present

## 2019-01-17 DIAGNOSIS — F0280 Dementia in other diseases classified elsewhere without behavioral disturbance: Secondary | ICD-10-CM | POA: Diagnosis not present

## 2019-01-17 DIAGNOSIS — M6281 Muscle weakness (generalized): Secondary | ICD-10-CM | POA: Diagnosis not present

## 2019-01-17 DIAGNOSIS — R41841 Cognitive communication deficit: Secondary | ICD-10-CM | POA: Diagnosis not present

## 2019-01-20 DIAGNOSIS — R279 Unspecified lack of coordination: Secondary | ICD-10-CM | POA: Diagnosis not present

## 2019-01-20 DIAGNOSIS — F0280 Dementia in other diseases classified elsewhere without behavioral disturbance: Secondary | ICD-10-CM | POA: Diagnosis not present

## 2019-01-20 DIAGNOSIS — M6281 Muscle weakness (generalized): Secondary | ICD-10-CM | POA: Diagnosis not present

## 2019-01-20 DIAGNOSIS — R41841 Cognitive communication deficit: Secondary | ICD-10-CM | POA: Diagnosis not present

## 2019-01-20 DIAGNOSIS — E538 Deficiency of other specified B group vitamins: Secondary | ICD-10-CM | POA: Diagnosis not present

## 2019-01-20 DIAGNOSIS — H8110 Benign paroxysmal vertigo, unspecified ear: Secondary | ICD-10-CM | POA: Diagnosis not present

## 2019-01-21 DIAGNOSIS — R41841 Cognitive communication deficit: Secondary | ICD-10-CM | POA: Diagnosis not present

## 2019-01-21 DIAGNOSIS — F0280 Dementia in other diseases classified elsewhere without behavioral disturbance: Secondary | ICD-10-CM | POA: Diagnosis not present

## 2019-01-21 DIAGNOSIS — E538 Deficiency of other specified B group vitamins: Secondary | ICD-10-CM | POA: Diagnosis not present

## 2019-01-21 DIAGNOSIS — H8110 Benign paroxysmal vertigo, unspecified ear: Secondary | ICD-10-CM | POA: Diagnosis not present

## 2019-01-21 DIAGNOSIS — M6281 Muscle weakness (generalized): Secondary | ICD-10-CM | POA: Diagnosis not present

## 2019-01-21 DIAGNOSIS — R279 Unspecified lack of coordination: Secondary | ICD-10-CM | POA: Diagnosis not present

## 2019-01-22 DIAGNOSIS — F0280 Dementia in other diseases classified elsewhere without behavioral disturbance: Secondary | ICD-10-CM | POA: Diagnosis not present

## 2019-01-22 DIAGNOSIS — Z593 Problems related to living in residential institution: Secondary | ICD-10-CM | POA: Diagnosis not present

## 2019-01-22 DIAGNOSIS — K219 Gastro-esophageal reflux disease without esophagitis: Secondary | ICD-10-CM | POA: Diagnosis not present

## 2019-01-22 DIAGNOSIS — H8110 Benign paroxysmal vertigo, unspecified ear: Secondary | ICD-10-CM | POA: Diagnosis not present

## 2019-01-22 DIAGNOSIS — R279 Unspecified lack of coordination: Secondary | ICD-10-CM | POA: Diagnosis not present

## 2019-01-22 DIAGNOSIS — R41841 Cognitive communication deficit: Secondary | ICD-10-CM | POA: Diagnosis not present

## 2019-01-22 DIAGNOSIS — F0390 Unspecified dementia without behavioral disturbance: Secondary | ICD-10-CM | POA: Diagnosis not present

## 2019-01-22 DIAGNOSIS — E538 Deficiency of other specified B group vitamins: Secondary | ICD-10-CM | POA: Diagnosis not present

## 2019-01-22 DIAGNOSIS — Z0000 Encounter for general adult medical examination without abnormal findings: Secondary | ICD-10-CM | POA: Diagnosis not present

## 2019-01-22 DIAGNOSIS — M6281 Muscle weakness (generalized): Secondary | ICD-10-CM | POA: Diagnosis not present

## 2019-01-23 DIAGNOSIS — F0280 Dementia in other diseases classified elsewhere without behavioral disturbance: Secondary | ICD-10-CM | POA: Diagnosis not present

## 2019-01-23 DIAGNOSIS — H8110 Benign paroxysmal vertigo, unspecified ear: Secondary | ICD-10-CM | POA: Diagnosis not present

## 2019-01-23 DIAGNOSIS — R279 Unspecified lack of coordination: Secondary | ICD-10-CM | POA: Diagnosis not present

## 2019-01-23 DIAGNOSIS — E538 Deficiency of other specified B group vitamins: Secondary | ICD-10-CM | POA: Diagnosis not present

## 2019-01-23 DIAGNOSIS — R41841 Cognitive communication deficit: Secondary | ICD-10-CM | POA: Diagnosis not present

## 2019-01-23 DIAGNOSIS — M6281 Muscle weakness (generalized): Secondary | ICD-10-CM | POA: Diagnosis not present

## 2019-01-24 DIAGNOSIS — H8110 Benign paroxysmal vertigo, unspecified ear: Secondary | ICD-10-CM | POA: Diagnosis not present

## 2019-01-24 DIAGNOSIS — M6281 Muscle weakness (generalized): Secondary | ICD-10-CM | POA: Diagnosis not present

## 2019-01-24 DIAGNOSIS — R41841 Cognitive communication deficit: Secondary | ICD-10-CM | POA: Diagnosis not present

## 2019-01-24 DIAGNOSIS — E538 Deficiency of other specified B group vitamins: Secondary | ICD-10-CM | POA: Diagnosis not present

## 2019-01-24 DIAGNOSIS — F0280 Dementia in other diseases classified elsewhere without behavioral disturbance: Secondary | ICD-10-CM | POA: Diagnosis not present

## 2019-01-24 DIAGNOSIS — R279 Unspecified lack of coordination: Secondary | ICD-10-CM | POA: Diagnosis not present

## 2019-01-27 DIAGNOSIS — E538 Deficiency of other specified B group vitamins: Secondary | ICD-10-CM | POA: Diagnosis not present

## 2019-01-27 DIAGNOSIS — H8110 Benign paroxysmal vertigo, unspecified ear: Secondary | ICD-10-CM | POA: Diagnosis not present

## 2019-01-27 DIAGNOSIS — R279 Unspecified lack of coordination: Secondary | ICD-10-CM | POA: Diagnosis not present

## 2019-01-27 DIAGNOSIS — R41841 Cognitive communication deficit: Secondary | ICD-10-CM | POA: Diagnosis not present

## 2019-01-27 DIAGNOSIS — F0280 Dementia in other diseases classified elsewhere without behavioral disturbance: Secondary | ICD-10-CM | POA: Diagnosis not present

## 2019-01-27 DIAGNOSIS — M6281 Muscle weakness (generalized): Secondary | ICD-10-CM | POA: Diagnosis not present

## 2019-01-28 DIAGNOSIS — E538 Deficiency of other specified B group vitamins: Secondary | ICD-10-CM | POA: Diagnosis not present

## 2019-01-28 DIAGNOSIS — F0280 Dementia in other diseases classified elsewhere without behavioral disturbance: Secondary | ICD-10-CM | POA: Diagnosis not present

## 2019-01-28 DIAGNOSIS — M6281 Muscle weakness (generalized): Secondary | ICD-10-CM | POA: Diagnosis not present

## 2019-01-28 DIAGNOSIS — H8110 Benign paroxysmal vertigo, unspecified ear: Secondary | ICD-10-CM | POA: Diagnosis not present

## 2019-01-28 DIAGNOSIS — R279 Unspecified lack of coordination: Secondary | ICD-10-CM | POA: Diagnosis not present

## 2019-01-28 DIAGNOSIS — R41841 Cognitive communication deficit: Secondary | ICD-10-CM | POA: Diagnosis not present

## 2019-01-29 ENCOUNTER — Other Ambulatory Visit: Payer: Self-pay

## 2019-01-29 DIAGNOSIS — M6281 Muscle weakness (generalized): Secondary | ICD-10-CM | POA: Diagnosis not present

## 2019-01-29 DIAGNOSIS — F0280 Dementia in other diseases classified elsewhere without behavioral disturbance: Secondary | ICD-10-CM | POA: Diagnosis not present

## 2019-01-29 DIAGNOSIS — E538 Deficiency of other specified B group vitamins: Secondary | ICD-10-CM | POA: Diagnosis not present

## 2019-01-29 DIAGNOSIS — H8110 Benign paroxysmal vertigo, unspecified ear: Secondary | ICD-10-CM | POA: Diagnosis not present

## 2019-01-29 DIAGNOSIS — R279 Unspecified lack of coordination: Secondary | ICD-10-CM | POA: Diagnosis not present

## 2019-01-29 DIAGNOSIS — R41841 Cognitive communication deficit: Secondary | ICD-10-CM | POA: Diagnosis not present

## 2019-01-30 DIAGNOSIS — M6281 Muscle weakness (generalized): Secondary | ICD-10-CM | POA: Diagnosis not present

## 2019-01-30 DIAGNOSIS — R41841 Cognitive communication deficit: Secondary | ICD-10-CM | POA: Diagnosis not present

## 2019-01-30 DIAGNOSIS — H8110 Benign paroxysmal vertigo, unspecified ear: Secondary | ICD-10-CM | POA: Diagnosis not present

## 2019-01-30 DIAGNOSIS — R279 Unspecified lack of coordination: Secondary | ICD-10-CM | POA: Diagnosis not present

## 2019-01-30 DIAGNOSIS — E538 Deficiency of other specified B group vitamins: Secondary | ICD-10-CM | POA: Diagnosis not present

## 2019-01-30 DIAGNOSIS — F0280 Dementia in other diseases classified elsewhere without behavioral disturbance: Secondary | ICD-10-CM | POA: Diagnosis not present

## 2019-01-31 ENCOUNTER — Ambulatory Visit: Payer: Medicare Other

## 2019-01-31 ENCOUNTER — Encounter: Payer: Self-pay | Admitting: Family Medicine

## 2019-01-31 ENCOUNTER — Ambulatory Visit (INDEPENDENT_AMBULATORY_CARE_PROVIDER_SITE_OTHER): Payer: Medicare Other | Admitting: Family Medicine

## 2019-01-31 ENCOUNTER — Other Ambulatory Visit: Payer: Self-pay

## 2019-01-31 ENCOUNTER — Telehealth: Payer: Self-pay

## 2019-01-31 DIAGNOSIS — R3129 Other microscopic hematuria: Secondary | ICD-10-CM

## 2019-01-31 DIAGNOSIS — R279 Unspecified lack of coordination: Secondary | ICD-10-CM | POA: Diagnosis not present

## 2019-01-31 DIAGNOSIS — F0390 Unspecified dementia without behavioral disturbance: Secondary | ICD-10-CM

## 2019-01-31 DIAGNOSIS — E538 Deficiency of other specified B group vitamins: Secondary | ICD-10-CM | POA: Diagnosis not present

## 2019-01-31 DIAGNOSIS — H8110 Benign paroxysmal vertigo, unspecified ear: Secondary | ICD-10-CM | POA: Diagnosis not present

## 2019-01-31 DIAGNOSIS — K219 Gastro-esophageal reflux disease without esophagitis: Secondary | ICD-10-CM | POA: Diagnosis not present

## 2019-01-31 DIAGNOSIS — R41841 Cognitive communication deficit: Secondary | ICD-10-CM | POA: Diagnosis not present

## 2019-01-31 DIAGNOSIS — M6281 Muscle weakness (generalized): Secondary | ICD-10-CM | POA: Diagnosis not present

## 2019-01-31 DIAGNOSIS — F0280 Dementia in other diseases classified elsewhere without behavioral disturbance: Secondary | ICD-10-CM | POA: Diagnosis not present

## 2019-01-31 MED ORDER — ESOMEPRAZOLE MAGNESIUM 40 MG PO CPDR: 40.0000 mg | DELAYED_RELEASE_CAPSULE | Freq: Every day | ORAL | 1 refills | Status: DC

## 2019-01-31 NOTE — Assessment & Plan Note (Signed)
She has moved into a memory unit.  Will refer to neurology.

## 2019-01-31 NOTE — Telephone Encounter (Signed)
Called patient several times for her scheduled appointment at 930 and no answer, no vm. Front office KP stated patient returned call to the office after session. At this point she was connected with her pcp to complete next visit of which call had also been missed. Please reschedule patient as appropriate for annual wellness visit.

## 2019-01-31 NOTE — Assessment & Plan Note (Signed)
Nexium refilled 

## 2019-01-31 NOTE — Assessment & Plan Note (Signed)
I discussed the importance of having her see urology as well as nephrology.  She has seen nephrology though has not yet seen urology.  A referral was placed again.

## 2019-01-31 NOTE — Progress Notes (Signed)
Virtual Visit via telephone Note  This visit type was conducted due to national recommendations for restrictions regarding the COVID-19 pandemic (e.g. social distancing).  This format is felt to be most appropriate for this patient at this time.  All issues noted in this document were discussed and addressed.  No physical exam was performed (except for noted visual exam findings with Video Visits).   I connected with Carol Stark today at 10:00 AM EDT by telephone and verified that I am speaking with the correct person using two identifiers. Location patient: home Location provider: work  Persons participating in the virtual visit: patient, provider  I discussed the limitations, risks, security and privacy concerns of performing an evaluation and management service by telephone and the availability of in person appointments. I also discussed with the patient that there may be a patient responsible charge related to this service. The patient expressed understanding and agreed to proceed.  Interactive audio and video telecommunications were attempted between this provider and patient, however failed, due to patient having technical difficulties OR patient did not have access to video capability.  We continued and completed visit with audio only.   Reason for visit: follow-up  HPI: Dementia: Patient notes her memory is a little iffy.  She feels like it is little bit better.  She has been moved into a dementia unit at her facility.  She is no longer driving.  She is doing okay at her new location.  Microscopic hematuria: She did see nephrology and they are evaluating her proteinuria though she has not seen urology yet.  She notes no gross hematuria.  No dysuria.  No foamy urine.  GERD: Taking Nexium.  No reflux, abdominal pain, blood in her stool, or dysphagia.   ROS: See pertinent positives and negatives per HPI.  Past Medical History:  Diagnosis Date  . Allergic rhinitis   .  Arthritis   . GERD (gastroesophageal reflux disease)   . History of chicken pox   . UTI (lower urinary tract infection)     Past Surgical History:  Procedure Laterality Date  . CATARACT EXTRACTION W/PHACO Right 06/21/2017   Procedure: CATARACT EXTRACTION PHACO AND INTRAOCULAR LENS PLACEMENT (IOC);  Surgeon: Leandrew Koyanagi, MD;  Location: [facility] ORS;  Service: Ophthalmology;  Laterality: Right;  [place] 00:55.2 AP% 14.4 CDE 7.93 Fluid Pack Lot # [id number]  . CATARACT EXTRACTION W/PHACO Left 09/25/2017   Procedure: CATARACT EXTRACTION PHACO AND INTRAOCULAR LENS PLACEMENT (IOC);  Surgeon: Leandrew Koyanagi, MD;  Location: [facility] ORS;  Service: Ophthalmology;  Laterality: Left;  [place] 00:46 AP% 14.2 CDE 6.65 Fluid pack lot # [id number]  . COLONOSCOPY    . TONSILLECTOMY      Family History  Problem Relation Age of Onset  . Arthritis Mother   . Heart disease Father     SOCIAL HX: nonsmoker   Current Outpatient Medications:  .  Ascorbic Acid (VITAMIN C) 500 MG CAPS, Take by mouth., Disp: , Rfl:  .  esomeprazole (NEXIUM) 40 MG capsule, Take 1 capsule (40 mg total) by mouth daily before breakfast., Disp: 90 capsule, Rfl: 1  EXAM: This was a telehealth telephone visit notes no physical exam was completed.  ASSESSMENT AND PLAN:  Discussed the following assessment and plan:  GERD (gastroesophageal reflux disease) Nexium refilled.  Microscopic hematuria I discussed the importance of having her see urology as well as nephrology.  She has seen nephrology though has not yet seen urology.  A referral was placed again.  Dementia ([place])  She has moved into a memory unit.  Will refer to neurology.    I discussed the assessment and treatment plan with the patient. The patient was provided an opportunity to ask questions and all were answered. The patient agreed with the plan and demonstrated an understanding of the instructions.   The patient was advised to call back or seek an in-person  evaluation if the symptoms worsen or if the condition fails to improve as anticipated.  I provided 10 minutes of non-face-to-face time during this encounter.   Tommi Rumps, MD

## 2019-02-03 DIAGNOSIS — R41841 Cognitive communication deficit: Secondary | ICD-10-CM | POA: Diagnosis not present

## 2019-02-03 DIAGNOSIS — R279 Unspecified lack of coordination: Secondary | ICD-10-CM | POA: Diagnosis not present

## 2019-02-03 DIAGNOSIS — M6281 Muscle weakness (generalized): Secondary | ICD-10-CM | POA: Diagnosis not present

## 2019-02-03 DIAGNOSIS — F0280 Dementia in other diseases classified elsewhere without behavioral disturbance: Secondary | ICD-10-CM | POA: Diagnosis not present

## 2019-02-03 DIAGNOSIS — H8110 Benign paroxysmal vertigo, unspecified ear: Secondary | ICD-10-CM | POA: Diagnosis not present

## 2019-02-03 DIAGNOSIS — E538 Deficiency of other specified B group vitamins: Secondary | ICD-10-CM | POA: Diagnosis not present

## 2019-02-04 DIAGNOSIS — R41841 Cognitive communication deficit: Secondary | ICD-10-CM | POA: Diagnosis not present

## 2019-02-04 DIAGNOSIS — E538 Deficiency of other specified B group vitamins: Secondary | ICD-10-CM | POA: Diagnosis not present

## 2019-02-04 DIAGNOSIS — F0280 Dementia in other diseases classified elsewhere without behavioral disturbance: Secondary | ICD-10-CM | POA: Diagnosis not present

## 2019-02-04 DIAGNOSIS — R279 Unspecified lack of coordination: Secondary | ICD-10-CM | POA: Diagnosis not present

## 2019-02-04 DIAGNOSIS — H8110 Benign paroxysmal vertigo, unspecified ear: Secondary | ICD-10-CM | POA: Diagnosis not present

## 2019-02-04 DIAGNOSIS — M6281 Muscle weakness (generalized): Secondary | ICD-10-CM | POA: Diagnosis not present

## 2019-02-05 DIAGNOSIS — F0280 Dementia in other diseases classified elsewhere without behavioral disturbance: Secondary | ICD-10-CM | POA: Diagnosis not present

## 2019-02-05 DIAGNOSIS — R279 Unspecified lack of coordination: Secondary | ICD-10-CM | POA: Diagnosis not present

## 2019-02-05 DIAGNOSIS — M6281 Muscle weakness (generalized): Secondary | ICD-10-CM | POA: Diagnosis not present

## 2019-02-05 DIAGNOSIS — E538 Deficiency of other specified B group vitamins: Secondary | ICD-10-CM | POA: Diagnosis not present

## 2019-02-05 DIAGNOSIS — H8110 Benign paroxysmal vertigo, unspecified ear: Secondary | ICD-10-CM | POA: Diagnosis not present

## 2019-02-05 DIAGNOSIS — R41841 Cognitive communication deficit: Secondary | ICD-10-CM | POA: Diagnosis not present

## 2019-02-06 DIAGNOSIS — F0280 Dementia in other diseases classified elsewhere without behavioral disturbance: Secondary | ICD-10-CM | POA: Diagnosis not present

## 2019-02-06 DIAGNOSIS — M6281 Muscle weakness (generalized): Secondary | ICD-10-CM | POA: Diagnosis not present

## 2019-02-06 DIAGNOSIS — E538 Deficiency of other specified B group vitamins: Secondary | ICD-10-CM | POA: Diagnosis not present

## 2019-02-06 DIAGNOSIS — R279 Unspecified lack of coordination: Secondary | ICD-10-CM | POA: Diagnosis not present

## 2019-02-06 DIAGNOSIS — R41841 Cognitive communication deficit: Secondary | ICD-10-CM | POA: Diagnosis not present

## 2019-02-06 DIAGNOSIS — H8110 Benign paroxysmal vertigo, unspecified ear: Secondary | ICD-10-CM | POA: Diagnosis not present

## 2019-02-07 DIAGNOSIS — R41841 Cognitive communication deficit: Secondary | ICD-10-CM | POA: Diagnosis not present

## 2019-02-07 DIAGNOSIS — E538 Deficiency of other specified B group vitamins: Secondary | ICD-10-CM | POA: Diagnosis not present

## 2019-02-07 DIAGNOSIS — H8110 Benign paroxysmal vertigo, unspecified ear: Secondary | ICD-10-CM | POA: Diagnosis not present

## 2019-02-07 DIAGNOSIS — F0280 Dementia in other diseases classified elsewhere without behavioral disturbance: Secondary | ICD-10-CM | POA: Diagnosis not present

## 2019-02-07 DIAGNOSIS — R279 Unspecified lack of coordination: Secondary | ICD-10-CM | POA: Diagnosis not present

## 2019-02-07 DIAGNOSIS — M6281 Muscle weakness (generalized): Secondary | ICD-10-CM | POA: Diagnosis not present

## 2019-02-14 DIAGNOSIS — R3129 Other microscopic hematuria: Secondary | ICD-10-CM | POA: Diagnosis not present

## 2019-02-17 ENCOUNTER — Encounter
Admission: RE | Admit: 2019-02-17 | Discharge: 2019-02-17 | Disposition: A | Discharge status: Home / Self Care | Payer: Medicare Other | Source: Ambulatory Visit | Attending: Internal Medicine | Admitting: Internal Medicine

## 2019-03-05 ENCOUNTER — Other Ambulatory Visit: Payer: Self-pay

## 2019-03-05 ENCOUNTER — Encounter: Payer: Self-pay | Admitting: Urology

## 2019-03-05 ENCOUNTER — Ambulatory Visit (INDEPENDENT_AMBULATORY_CARE_PROVIDER_SITE_OTHER): Payer: Medicare Other | Admitting: Urology

## 2019-03-05 VITALS — BP 132/85 | HR 99 | Ht 60.0 in | Wt 110.0 lb

## 2019-03-05 DIAGNOSIS — R310 Gross hematuria: Secondary | ICD-10-CM | POA: Diagnosis not present

## 2019-03-05 DIAGNOSIS — R3121 Asymptomatic microscopic hematuria: Secondary | ICD-10-CM

## 2019-03-05 LAB — URINALYSIS, COMPLETE
Bilirubin, UA: NEGATIVE
Glucose, UA: NEGATIVE
Ketones, UA: NEGATIVE
Nitrite, UA: NEGATIVE
Protein,UA: NEGATIVE
Specific Gravity, UA: >1.03 — ABNORMAL HIGH (ref 1.005–1.030)
Urobilinogen, Ur: 0.2 mg/dL (ref 0.2–1.0)
pH, UA: 6 (ref 5.0–7.5)

## 2019-03-05 LAB — MICROSCOPIC EXAMINATION
Bacteria, UA: NONE SEEN
RBC, Urine: NONE SEEN /hpf (ref 0–2)

## 2019-03-05 NOTE — Progress Notes (Signed)
03/05/2019 9:22 AM   Carol Stark June 07, 1931 ID:[id number]  Referring provider: Leone Haven, MD [address] [place],  [place] [address]  Chief Complaint  Patient presents with  . Hematuria    HPI: She is referred for MH. Non-smoker. No chemo or XRT. No chemical exposure. She had a urinalysis August 2019 with 7-10 red blood cells and then 0-5 on 12/19/2018. No voiding complaints.   Modifying factors: There are no other modifying factors  Associated signs and symptoms: There are no other associated signs and symptoms Aggravating and relieving factors: There are no other aggravating or relieving factors Severity: Moderate Duration: Persistent    PMH: Past Medical History:  Diagnosis Date  . Allergic rhinitis   . Arthritis   . GERD (gastroesophageal reflux disease)   . History of chicken pox   . UTI (lower urinary tract infection)     Surgical History: Past Surgical History:  Procedure Laterality Date  . CATARACT EXTRACTION W/PHACO Right 06/21/2017   Procedure: CATARACT EXTRACTION PHACO AND INTRAOCULAR LENS PLACEMENT (IOC);  Surgeon: Leandrew Koyanagi, MD;  Location: [facility] ORS;  Service: Ophthalmology;  Laterality: Right;  [place] 00:55.2 AP% 14.4 CDE 7.93 Fluid Pack Lot # [id number]  . CATARACT EXTRACTION W/PHACO Left 09/25/2017   Procedure: CATARACT EXTRACTION PHACO AND INTRAOCULAR LENS PLACEMENT (IOC);  Surgeon: Leandrew Koyanagi, MD;  Location: [facility] ORS;  Service: Ophthalmology;  Laterality: Left;  [place] 00:46 AP% 14.2 CDE 6.65 Fluid pack lot # [id number]  . COLONOSCOPY    . TONSILLECTOMY      Home Medications:  Allergies as of 03/05/2019      Reactions   Biaxin [clarithromycin] Other (See Comments)   "Was not good"      Medication List       Accurate as of March 05, 2019  9:22 AM. If you have any questions, ask your nurse or doctor.        Calcium 1000 + D 1000-800 MG-UNIT Tabs Generic drug: Calcium Carb-Cholecalciferol Take by  mouth.   esomeprazole 40 MG capsule Commonly known as: NEXIUM Take 1 capsule (40 mg total) by mouth daily before breakfast.   estradiol 0.1 MG/GM vaginal cream Commonly known as: ESTRACE   fluticasone 50 MCG/ACT nasal spray Commonly known as: FLONASE Place into the nose.   Vitamin C 500 MG Caps Take by mouth.       Allergies:  Allergies  Allergen Reactions  . Biaxin [Clarithromycin] Other (See Comments)    "Was not good"    Family History: Family History  Problem Relation Age of Onset  . Arthritis Mother   . Heart disease Father     Social History:  reports that she has never smoked. She has never used smokeless tobacco. She reports current alcohol use. She reports that she does not use drugs.  ROS: UROLOGY Frequent Urination?: No Hard to postpone urination?: No Burning/pain with urination?: No Get up at night to urinate?: Yes Leakage of urine?: Yes Urine stream starts and stops?: No Trouble starting stream?: No Do you have to strain to urinate?: No Blood in urine?: No Urinary tract infection?: No Sexually transmitted disease?: No Injury to kidneys or bladder?: No Painful intercourse?: No Weak stream?: No Currently pregnant?: No Vaginal bleeding?: No Last menstrual period?: n  Gastrointestinal Nausea?: No Vomiting?: No Indigestion/heartburn?: Yes Diarrhea?: No Constipation?: No  Constitutional Fever: No Night sweats?: No Weight loss?: No Fatigue?: No  Skin Skin rash/lesions?: No Itching?: No  Eyes Blurred vision?: No Double vision?:  No  Ears/Nose/Throat Sore throat?: Yes Sinus problems?: Yes  Hematologic/Lymphatic Swollen glands?: No Easy bruising?: No  Cardiovascular Leg swelling?: No Chest pain?: No  Respiratory Cough?: No Shortness of breath?: No  Endocrine Excessive thirst?: No  Musculoskeletal Back pain?: No Joint pain?: No  Neurological Headaches?: No Dizziness?: No  Psychologic Depression?: No Anxiety?: No   Physical Exam: BP 132/85   Pulse 99   Ht 5' (1.524 m)   Wt 49.9 kg   LMP  (LMP Unknown)   BMI 21.48 kg/m   Constitutional:  Alert and oriented, No acute distress. HEENT: [place] AT, moist mucus membranes.  Trachea midline, no masses. Cardiovascular: No clubbing, cyanosis, or edema. Respiratory: Normal respiratory effort, no increased work of breathing. GI: Abdomen is soft, nontender, nondistended, no abdominal masses GU: No CVA tenderness Lymph: No cervical or inguinal lymphadenopathy. Skin: No rashes, bruises or suspicious lesions. Neurologic: Grossly intact, no focal deficits, moving all 4 extremities. Psychiatric: Normal mood and affect.  Laboratory Data: Lab Results  Component Value Date   WBC 5.5 12/18/2018   HGB 12.5 12/18/2018   HCT 38.7 12/18/2018   MCV 90.0 12/18/2018   PLT 223 12/18/2018    Lab Results  Component Value Date   CREATININE 0.72 12/18/2018    No results found for: PSA  No results found for: TESTOSTERONE  No results found for: HGBA1C  Urinalysis    Component Value Date/Time   COLORURINE YELLOW (A) 12/19/2018 0950   APPEARANCEUR CLOUDY (A) 12/19/2018 0950   LABSPEC 1.021 12/19/2018 0950   PHURINE 5.0 12/19/2018 0950   GLUCOSEU NEGATIVE 12/19/2018 0950   HGBUR SMALL (A) 12/19/2018 0950   BILIRUBINUR NEGATIVE 12/19/2018 0950   BILIRUBINUR 1+ 05/27/2018 1434   KETONESUR NEGATIVE 12/19/2018 0950   PROTEINUR NEGATIVE 12/19/2018 0950   UROBILINOGEN 1.0 05/27/2018 1434   NITRITE NEGATIVE 12/19/2018 0950   LEUKOCYTESUR LARGE (A) 12/19/2018 0950    Lab Results  Component Value Date   MUCUS Presence of (A) 11/29/2017   BACTERIA FEW (A) 12/19/2018    Pertinent Imaging: n/a No results found for this or any previous visit. No results found for this or any previous visit. No results found for this or any previous visit. No results found for this or any previous visit. No results found for this or any previous visit. No results found for this  or any previous visit. No results found for this or any previous visit. No results found for this or any previous visit.  Assessment & Plan:    1. microscpic hematuria -overall I think she is low risk, but her age does put her in a high risk category per AUA guidelines of microscopic hematuria and we went over the nature risk benefits and alternatives to a CT scan with IV contrast and flexible cystoscopy.  All questions answered and she elects to proceed.  She is here with her caregiver [place].   - Urinalysis, Complete   No follow-ups on file.  Festus Aloe, MD  [facility] [address], [place], [place] [address] [phone]

## 2019-03-05 NOTE — Patient Instructions (Signed)
Cystoscopy Cystoscopy is a procedure that is used to help diagnose and sometimes treat conditions that affect the lower urinary tract. The lower urinary tract includes the bladder and the urethra. The urethra is the tube that drains urine from the bladder. Cystoscopy is done using a thin, tube-shaped instrument with a light and camera at the end (cystoscope). The cystoscope may be hard or flexible, depending on the goal of the procedure. The cystoscope is inserted through the urethra, into the bladder. Cystoscopy may be recommended if you have:  Urinary tract infections that keep coming back.  Blood in the urine (hematuria).  An inability to control when you urinate (urinary incontinence) or an overactive bladder.  Unusual cells found in a urine sample.  A blockage in the urethra, such as a urinary stone.  Painful urination.  An abnormality in the bladder found during an intravenous pyelogram (IVP) or CT scan. Cystoscopy may also be done to remove a sample of tissue to be examined under a microscope (biopsy). Tell a health care provider about:  Any allergies you have.  All medicines you are taking, including vitamins, herbs, eye drops, creams, and over-the-counter medicines.  Any problems you or family members have had with anesthetic medicines.  Any blood disorders you have.  Any surgeries you have had.  Any medical conditions you have.  Whether you are pregnant or may be pregnant. What are the risks? Generally, this is a safe procedure. However, problems may occur, including:  Infection.  Bleeding.  Allergic reactions to medicines.  Damage to other structures or organs. What happens before the procedure?  Ask your health care provider about: ? Changing or stopping your regular medicines. This is especially important if you are taking diabetes medicines or blood thinners. ? Taking medicines such as aspirin and ibuprofen. These medicines can thin your blood. Do not take  these medicines unless your health care provider tells you to take them. ? Taking over-the-counter medicines, vitamins, herbs, and supplements.  Follow instructions from your health care provider about eating or drinking restrictions.  Ask your health care provider what steps will be taken to help prevent infection. These may include: ? Washing skin with a germ-killing soap. ? Taking antibiotic medicine.  You may have an exam or testing, such as: ? X-rays of the bladder, urethra, or kidneys. ? Urine tests to check for signs of infection.  Plan to have someone take you home from the hospital or clinic. What happens during the procedure?   You will be given one or more of the following: ? A medicine to help you relax (sedative). ? A medicine to numb the area (local anesthetic).  The area around the opening of your urethra will be cleaned.  The cystoscope will be passed through your urethra into your bladder.  Germ-free (sterile) fluid will flow through the cystoscope to fill your bladder. The fluid will stretch your bladder so that your health care provider can clearly examine your bladder walls.  Your doctor will look at the urethra and bladder. Your doctor may take a biopsy or remove stones.  The cystoscope will be removed, and your bladder will be emptied. The procedure may vary among health care providers and hospitals. What can I expect after the procedure? After the procedure, it is common to have:  Some soreness or pain in your abdomen and urethra.  Urinary symptoms. These include: ? Mild pain or burning when you urinate. Pain should stop within a few minutes after you urinate. This   may last for up to 1 week. ? A small amount of blood in your urine for several days. ? Feeling like you need to urinate but producing only a small amount of urine. Follow these instructions at home: Medicines  Take over-the-counter and prescription medicines only as told by your health care  provider.  If you were prescribed an antibiotic medicine, take it as told by your health care provider. Do not stop taking the antibiotic even if you start to feel better. General instructions  Return to your normal activities as told by your health care provider. Ask your health care provider what activities are safe for you.  Do not drive for 24 hours if you were given a sedative during your procedure.  Watch for any blood in your urine. If the amount of blood in your urine increases, call your health care provider.  Follow instructions from your health care provider about eating or drinking restrictions.  If a tissue sample was removed for testing (biopsy) during your procedure, it is up to you to get your test results. Ask your health care provider, or the department that is doing the test, when your results will be ready.  Drink enough fluid to keep your urine pale yellow.  Keep all follow-up visits as told by your health care provider. This is important. Contact a health care provider if you:  Have pain that gets worse or does not get better with medicine, especially pain when you urinate.  Have trouble urinating.  Have more blood in your urine. Get help right away if you:  Have blood clots in your urine.  Have abdominal pain.  Have a fever or chills.  Are unable to urinate. Summary  Cystoscopy is a procedure that is used to help diagnose and sometimes treat conditions that affect the lower urinary tract.  Cystoscopy is done using a thin, tube-shaped instrument with a light and camera at the end.  After the procedure, it is common to have some soreness or pain in your abdomen and urethra.  Watch for any blood in your urine. If the amount of blood in your urine increases, call your health care provider.  If you were prescribed an antibiotic medicine, take it as told by your health care provider. Do not stop taking the antibiotic even if you start to feel better. This  information is not intended to replace advice given to you by your health care provider. Make sure you discuss any questions you have with your health care provider. Document Released: 03/24/2000 Document Revised: 03/19/2018 Document Reviewed: 03/19/2018 Elsevier Patient Education  2020 Elsevier Inc.  

## 2019-03-11 DIAGNOSIS — H8110 Benign paroxysmal vertigo, unspecified ear: Secondary | ICD-10-CM | POA: Diagnosis not present

## 2019-03-11 DIAGNOSIS — K219 Gastro-esophageal reflux disease without esophagitis: Secondary | ICD-10-CM | POA: Diagnosis not present

## 2019-03-11 DIAGNOSIS — R41841 Cognitive communication deficit: Secondary | ICD-10-CM | POA: Diagnosis not present

## 2019-03-11 DIAGNOSIS — M1612 Unilateral primary osteoarthritis, left hip: Secondary | ICD-10-CM | POA: Diagnosis not present

## 2019-03-11 DIAGNOSIS — R279 Unspecified lack of coordination: Secondary | ICD-10-CM | POA: Diagnosis not present

## 2019-03-11 DIAGNOSIS — M6281 Muscle weakness (generalized): Secondary | ICD-10-CM | POA: Diagnosis not present

## 2019-03-11 DIAGNOSIS — F0280 Dementia in other diseases classified elsewhere without behavioral disturbance: Secondary | ICD-10-CM | POA: Diagnosis not present

## 2019-03-11 DIAGNOSIS — E538 Deficiency of other specified B group vitamins: Secondary | ICD-10-CM | POA: Diagnosis not present

## 2019-03-12 DIAGNOSIS — E559 Vitamin D deficiency, unspecified: Secondary | ICD-10-CM | POA: Diagnosis not present

## 2019-03-12 DIAGNOSIS — E519 Thiamine deficiency, unspecified: Secondary | ICD-10-CM | POA: Diagnosis not present

## 2019-03-12 DIAGNOSIS — H8110 Benign paroxysmal vertigo, unspecified ear: Secondary | ICD-10-CM | POA: Diagnosis not present

## 2019-03-12 DIAGNOSIS — F0280 Dementia in other diseases classified elsewhere without behavioral disturbance: Secondary | ICD-10-CM | POA: Diagnosis not present

## 2019-03-12 DIAGNOSIS — E611 Iron deficiency: Secondary | ICD-10-CM | POA: Diagnosis not present

## 2019-03-12 DIAGNOSIS — R399 Unspecified symptoms and signs involving the genitourinary system: Secondary | ICD-10-CM | POA: Diagnosis not present

## 2019-03-12 DIAGNOSIS — M6281 Muscle weakness (generalized): Secondary | ICD-10-CM | POA: Diagnosis not present

## 2019-03-12 DIAGNOSIS — Z131 Encounter for screening for diabetes mellitus: Secondary | ICD-10-CM | POA: Diagnosis not present

## 2019-03-12 DIAGNOSIS — R413 Other amnesia: Secondary | ICD-10-CM | POA: Diagnosis not present

## 2019-03-12 DIAGNOSIS — E538 Deficiency of other specified B group vitamins: Secondary | ICD-10-CM | POA: Diagnosis not present

## 2019-03-12 DIAGNOSIS — E0949 Drug or chemical induced diabetes mellitus with neurological complications with other diabetic neurological complication: Secondary | ICD-10-CM | POA: Diagnosis not present

## 2019-03-12 DIAGNOSIS — R41841 Cognitive communication deficit: Secondary | ICD-10-CM | POA: Diagnosis not present

## 2019-03-12 DIAGNOSIS — R279 Unspecified lack of coordination: Secondary | ICD-10-CM | POA: Diagnosis not present

## 2019-03-13 DIAGNOSIS — R41841 Cognitive communication deficit: Secondary | ICD-10-CM | POA: Diagnosis not present

## 2019-03-13 DIAGNOSIS — E538 Deficiency of other specified B group vitamins: Secondary | ICD-10-CM | POA: Diagnosis not present

## 2019-03-13 DIAGNOSIS — M6281 Muscle weakness (generalized): Secondary | ICD-10-CM | POA: Diagnosis not present

## 2019-03-13 DIAGNOSIS — F0280 Dementia in other diseases classified elsewhere without behavioral disturbance: Secondary | ICD-10-CM | POA: Diagnosis not present

## 2019-03-13 DIAGNOSIS — H8110 Benign paroxysmal vertigo, unspecified ear: Secondary | ICD-10-CM | POA: Diagnosis not present

## 2019-03-13 DIAGNOSIS — R279 Unspecified lack of coordination: Secondary | ICD-10-CM | POA: Diagnosis not present

## 2019-03-14 DIAGNOSIS — H8110 Benign paroxysmal vertigo, unspecified ear: Secondary | ICD-10-CM | POA: Diagnosis not present

## 2019-03-14 DIAGNOSIS — F0280 Dementia in other diseases classified elsewhere without behavioral disturbance: Secondary | ICD-10-CM | POA: Diagnosis not present

## 2019-03-14 DIAGNOSIS — M6281 Muscle weakness (generalized): Secondary | ICD-10-CM | POA: Diagnosis not present

## 2019-03-14 DIAGNOSIS — R41841 Cognitive communication deficit: Secondary | ICD-10-CM | POA: Diagnosis not present

## 2019-03-14 DIAGNOSIS — E538 Deficiency of other specified B group vitamins: Secondary | ICD-10-CM | POA: Diagnosis not present

## 2019-03-14 DIAGNOSIS — R279 Unspecified lack of coordination: Secondary | ICD-10-CM | POA: Diagnosis not present

## 2019-03-17 DIAGNOSIS — E538 Deficiency of other specified B group vitamins: Secondary | ICD-10-CM | POA: Diagnosis not present

## 2019-03-17 DIAGNOSIS — M6281 Muscle weakness (generalized): Secondary | ICD-10-CM | POA: Diagnosis not present

## 2019-03-17 DIAGNOSIS — R279 Unspecified lack of coordination: Secondary | ICD-10-CM | POA: Diagnosis not present

## 2019-03-17 DIAGNOSIS — F0280 Dementia in other diseases classified elsewhere without behavioral disturbance: Secondary | ICD-10-CM | POA: Diagnosis not present

## 2019-03-17 DIAGNOSIS — H8110 Benign paroxysmal vertigo, unspecified ear: Secondary | ICD-10-CM | POA: Diagnosis not present

## 2019-03-17 DIAGNOSIS — R41841 Cognitive communication deficit: Secondary | ICD-10-CM | POA: Diagnosis not present

## 2019-03-18 ENCOUNTER — Other Ambulatory Visit
Admission: RE | Admit: 2019-03-18 | Discharge: 2019-03-18 | Disposition: A | Discharge status: Home / Self Care | Payer: Medicare Other | Source: Skilled Nursing Facility | Attending: Internal Medicine | Admitting: Internal Medicine

## 2019-03-18 DIAGNOSIS — R41841 Cognitive communication deficit: Secondary | ICD-10-CM | POA: Diagnosis not present

## 2019-03-18 DIAGNOSIS — R279 Unspecified lack of coordination: Secondary | ICD-10-CM | POA: Diagnosis not present

## 2019-03-18 DIAGNOSIS — E538 Deficiency of other specified B group vitamins: Secondary | ICD-10-CM | POA: Diagnosis not present

## 2019-03-18 DIAGNOSIS — H8110 Benign paroxysmal vertigo, unspecified ear: Secondary | ICD-10-CM | POA: Diagnosis not present

## 2019-03-18 DIAGNOSIS — F0280 Dementia in other diseases classified elsewhere without behavioral disturbance: Secondary | ICD-10-CM | POA: Diagnosis not present

## 2019-03-18 DIAGNOSIS — R3121 Asymptomatic microscopic hematuria: Secondary | ICD-10-CM | POA: Insufficient documentation

## 2019-03-18 DIAGNOSIS — M6281 Muscle weakness (generalized): Secondary | ICD-10-CM | POA: Diagnosis not present

## 2019-03-18 LAB — CREATININE, SERUM
Creatinine, Ser: 0.81 mg/dL (ref 0.44–1.00)
GFR calc Af Amer: >60 mL/min (ref >60)
GFR calc non Af Amer: >60 mL/min (ref >60)

## 2019-03-18 LAB — BUN: BUN: 19 mg/dL (ref 8–23)

## 2019-03-19 DIAGNOSIS — R41841 Cognitive communication deficit: Secondary | ICD-10-CM | POA: Diagnosis not present

## 2019-03-19 DIAGNOSIS — E538 Deficiency of other specified B group vitamins: Secondary | ICD-10-CM | POA: Diagnosis not present

## 2019-03-19 DIAGNOSIS — F0280 Dementia in other diseases classified elsewhere without behavioral disturbance: Secondary | ICD-10-CM | POA: Diagnosis not present

## 2019-03-19 DIAGNOSIS — M6281 Muscle weakness (generalized): Secondary | ICD-10-CM | POA: Diagnosis not present

## 2019-03-19 DIAGNOSIS — H8110 Benign paroxysmal vertigo, unspecified ear: Secondary | ICD-10-CM | POA: Diagnosis not present

## 2019-03-19 DIAGNOSIS — R279 Unspecified lack of coordination: Secondary | ICD-10-CM | POA: Diagnosis not present

## 2019-03-20 ENCOUNTER — Other Ambulatory Visit
Admission: RE | Admit: 2019-03-20 | Discharge: 2019-03-20 | Disposition: A | Discharge status: Home / Self Care | Payer: Medicare Other | Source: Ambulatory Visit | Attending: Internal Medicine | Admitting: Internal Medicine

## 2019-03-20 DIAGNOSIS — E538 Deficiency of other specified B group vitamins: Secondary | ICD-10-CM | POA: Diagnosis not present

## 2019-03-20 DIAGNOSIS — M6281 Muscle weakness (generalized): Secondary | ICD-10-CM | POA: Diagnosis not present

## 2019-03-20 DIAGNOSIS — H8110 Benign paroxysmal vertigo, unspecified ear: Secondary | ICD-10-CM | POA: Diagnosis not present

## 2019-03-20 DIAGNOSIS — F0280 Dementia in other diseases classified elsewhere without behavioral disturbance: Secondary | ICD-10-CM | POA: Diagnosis not present

## 2019-03-20 DIAGNOSIS — R279 Unspecified lack of coordination: Secondary | ICD-10-CM | POA: Diagnosis not present

## 2019-03-20 DIAGNOSIS — R41841 Cognitive communication deficit: Secondary | ICD-10-CM | POA: Diagnosis not present

## 2019-03-20 LAB — VITAMIN B12: Vitamin B-12: 179 pg/mL — ABNORMAL LOW (ref 180–914)

## 2019-03-21 ENCOUNTER — Ambulatory Visit: Admission: RE | Admit: 2019-03-21 | Payer: Medicare Other | Source: Ambulatory Visit

## 2019-03-21 ENCOUNTER — Other Ambulatory Visit: Payer: Self-pay

## 2019-03-21 ENCOUNTER — Ambulatory Visit
Admission: RE | Admit: 2019-03-21 | Discharge: 2019-03-21 | Disposition: A | Discharge status: Home / Self Care | Payer: Medicare Other | Source: Ambulatory Visit | Attending: Urology | Admitting: Urology

## 2019-03-21 DIAGNOSIS — R3121 Asymptomatic microscopic hematuria: Secondary | ICD-10-CM | POA: Diagnosis present

## 2019-03-21 MED ORDER — IOHEXOL 300 MG/ML  SOLN
100.0000 mL | Freq: Once | INTRAMUSCULAR | Status: AC | PRN
  Administered 2019-03-21: 100 mL via INTRAVENOUS

## 2019-03-24 DIAGNOSIS — E538 Deficiency of other specified B group vitamins: Secondary | ICD-10-CM | POA: Diagnosis not present

## 2019-03-24 DIAGNOSIS — M6281 Muscle weakness (generalized): Secondary | ICD-10-CM | POA: Diagnosis not present

## 2019-03-24 DIAGNOSIS — R279 Unspecified lack of coordination: Secondary | ICD-10-CM | POA: Diagnosis not present

## 2019-03-24 DIAGNOSIS — F0280 Dementia in other diseases classified elsewhere without behavioral disturbance: Secondary | ICD-10-CM | POA: Diagnosis not present

## 2019-03-24 DIAGNOSIS — H8110 Benign paroxysmal vertigo, unspecified ear: Secondary | ICD-10-CM | POA: Diagnosis not present

## 2019-03-24 DIAGNOSIS — R41841 Cognitive communication deficit: Secondary | ICD-10-CM | POA: Diagnosis not present

## 2019-03-25 DIAGNOSIS — F0280 Dementia in other diseases classified elsewhere without behavioral disturbance: Secondary | ICD-10-CM | POA: Diagnosis not present

## 2019-03-25 DIAGNOSIS — M6281 Muscle weakness (generalized): Secondary | ICD-10-CM | POA: Diagnosis not present

## 2019-03-25 DIAGNOSIS — H8110 Benign paroxysmal vertigo, unspecified ear: Secondary | ICD-10-CM | POA: Diagnosis not present

## 2019-03-25 DIAGNOSIS — R279 Unspecified lack of coordination: Secondary | ICD-10-CM | POA: Diagnosis not present

## 2019-03-25 DIAGNOSIS — R41841 Cognitive communication deficit: Secondary | ICD-10-CM | POA: Diagnosis not present

## 2019-03-25 DIAGNOSIS — E538 Deficiency of other specified B group vitamins: Secondary | ICD-10-CM | POA: Diagnosis not present

## 2019-03-26 DIAGNOSIS — F0280 Dementia in other diseases classified elsewhere without behavioral disturbance: Secondary | ICD-10-CM | POA: Diagnosis not present

## 2019-03-26 DIAGNOSIS — Z20828 Contact with and (suspected) exposure to other viral communicable diseases: Secondary | ICD-10-CM | POA: Diagnosis not present

## 2019-03-26 DIAGNOSIS — E538 Deficiency of other specified B group vitamins: Secondary | ICD-10-CM | POA: Diagnosis not present

## 2019-03-26 DIAGNOSIS — R41841 Cognitive communication deficit: Secondary | ICD-10-CM | POA: Diagnosis not present

## 2019-03-26 DIAGNOSIS — M6281 Muscle weakness (generalized): Secondary | ICD-10-CM | POA: Diagnosis not present

## 2019-03-26 DIAGNOSIS — H8110 Benign paroxysmal vertigo, unspecified ear: Secondary | ICD-10-CM | POA: Diagnosis not present

## 2019-03-26 DIAGNOSIS — R279 Unspecified lack of coordination: Secondary | ICD-10-CM | POA: Diagnosis not present

## 2019-03-27 DIAGNOSIS — H8110 Benign paroxysmal vertigo, unspecified ear: Secondary | ICD-10-CM | POA: Diagnosis not present

## 2019-03-27 DIAGNOSIS — M6281 Muscle weakness (generalized): Secondary | ICD-10-CM | POA: Diagnosis not present

## 2019-03-27 DIAGNOSIS — R41841 Cognitive communication deficit: Secondary | ICD-10-CM | POA: Diagnosis not present

## 2019-03-27 DIAGNOSIS — R279 Unspecified lack of coordination: Secondary | ICD-10-CM | POA: Diagnosis not present

## 2019-03-27 DIAGNOSIS — E538 Deficiency of other specified B group vitamins: Secondary | ICD-10-CM | POA: Diagnosis not present

## 2019-03-27 DIAGNOSIS — F0280 Dementia in other diseases classified elsewhere without behavioral disturbance: Secondary | ICD-10-CM | POA: Diagnosis not present

## 2019-03-28 ENCOUNTER — Ambulatory Visit: Payer: Medicare Other | Admitting: Family Medicine

## 2019-03-31 ENCOUNTER — Ambulatory Visit: Admission: RE | Admit: 2019-03-31 | Payer: Medicare Other | Source: Ambulatory Visit

## 2019-03-31 DIAGNOSIS — Z20828 Contact with and (suspected) exposure to other viral communicable diseases: Secondary | ICD-10-CM | POA: Diagnosis not present

## 2019-04-07 DIAGNOSIS — Z20828 Contact with and (suspected) exposure to other viral communicable diseases: Secondary | ICD-10-CM | POA: Diagnosis not present

## 2019-04-10 DIAGNOSIS — E538 Deficiency of other specified B group vitamins: Secondary | ICD-10-CM | POA: Diagnosis not present

## 2019-04-10 DIAGNOSIS — R41841 Cognitive communication deficit: Secondary | ICD-10-CM | POA: Diagnosis not present

## 2019-04-10 DIAGNOSIS — M6281 Muscle weakness (generalized): Secondary | ICD-10-CM | POA: Diagnosis not present

## 2019-04-10 DIAGNOSIS — F0280 Dementia in other diseases classified elsewhere without behavioral disturbance: Secondary | ICD-10-CM | POA: Diagnosis not present

## 2019-04-10 DIAGNOSIS — H8110 Benign paroxysmal vertigo, unspecified ear: Secondary | ICD-10-CM | POA: Diagnosis not present

## 2019-04-10 DIAGNOSIS — R279 Unspecified lack of coordination: Secondary | ICD-10-CM | POA: Diagnosis not present

## 2019-04-14 DIAGNOSIS — R279 Unspecified lack of coordination: Secondary | ICD-10-CM | POA: Diagnosis not present

## 2019-04-14 DIAGNOSIS — G309 Alzheimer's disease, unspecified: Secondary | ICD-10-CM | POA: Diagnosis not present

## 2019-04-14 DIAGNOSIS — R4701 Aphasia: Secondary | ICD-10-CM | POA: Diagnosis not present

## 2019-04-14 DIAGNOSIS — R41841 Cognitive communication deficit: Secondary | ICD-10-CM | POA: Diagnosis not present

## 2019-04-14 DIAGNOSIS — M6281 Muscle weakness (generalized): Secondary | ICD-10-CM | POA: Diagnosis not present

## 2019-04-15 DIAGNOSIS — R4701 Aphasia: Secondary | ICD-10-CM | POA: Diagnosis not present

## 2019-04-15 DIAGNOSIS — G309 Alzheimer's disease, unspecified: Secondary | ICD-10-CM | POA: Diagnosis not present

## 2019-04-15 DIAGNOSIS — M6281 Muscle weakness (generalized): Secondary | ICD-10-CM | POA: Diagnosis not present

## 2019-04-15 DIAGNOSIS — R41841 Cognitive communication deficit: Secondary | ICD-10-CM | POA: Diagnosis not present

## 2019-04-15 DIAGNOSIS — R279 Unspecified lack of coordination: Secondary | ICD-10-CM | POA: Diagnosis not present

## 2019-04-16 DIAGNOSIS — R41841 Cognitive communication deficit: Secondary | ICD-10-CM | POA: Diagnosis not present

## 2019-04-16 DIAGNOSIS — R279 Unspecified lack of coordination: Secondary | ICD-10-CM | POA: Diagnosis not present

## 2019-04-16 DIAGNOSIS — G309 Alzheimer's disease, unspecified: Secondary | ICD-10-CM | POA: Diagnosis not present

## 2019-04-16 DIAGNOSIS — M6281 Muscle weakness (generalized): Secondary | ICD-10-CM | POA: Diagnosis not present

## 2019-04-16 DIAGNOSIS — R4701 Aphasia: Secondary | ICD-10-CM | POA: Diagnosis not present

## 2019-04-17 DIAGNOSIS — R4701 Aphasia: Secondary | ICD-10-CM | POA: Diagnosis not present

## 2019-04-17 DIAGNOSIS — R279 Unspecified lack of coordination: Secondary | ICD-10-CM | POA: Diagnosis not present

## 2019-04-17 DIAGNOSIS — G309 Alzheimer's disease, unspecified: Secondary | ICD-10-CM | POA: Diagnosis not present

## 2019-04-17 DIAGNOSIS — R41841 Cognitive communication deficit: Secondary | ICD-10-CM | POA: Diagnosis not present

## 2019-04-17 DIAGNOSIS — M6281 Muscle weakness (generalized): Secondary | ICD-10-CM | POA: Diagnosis not present

## 2019-04-18 ENCOUNTER — Other Ambulatory Visit: Payer: Medicare Other | Admitting: Urology

## 2019-04-18 DIAGNOSIS — G309 Alzheimer's disease, unspecified: Secondary | ICD-10-CM | POA: Diagnosis not present

## 2019-04-18 DIAGNOSIS — R279 Unspecified lack of coordination: Secondary | ICD-10-CM | POA: Diagnosis not present

## 2019-04-18 DIAGNOSIS — M6281 Muscle weakness (generalized): Secondary | ICD-10-CM | POA: Diagnosis not present

## 2019-04-18 DIAGNOSIS — Z23 Encounter for immunization: Secondary | ICD-10-CM | POA: Diagnosis not present

## 2019-04-18 DIAGNOSIS — R41841 Cognitive communication deficit: Secondary | ICD-10-CM | POA: Diagnosis not present

## 2019-04-18 DIAGNOSIS — R4701 Aphasia: Secondary | ICD-10-CM | POA: Diagnosis not present

## 2019-04-21 DIAGNOSIS — Z20828 Contact with and (suspected) exposure to other viral communicable diseases: Secondary | ICD-10-CM | POA: Diagnosis not present

## 2019-04-21 DIAGNOSIS — R41841 Cognitive communication deficit: Secondary | ICD-10-CM | POA: Diagnosis not present

## 2019-04-21 DIAGNOSIS — R279 Unspecified lack of coordination: Secondary | ICD-10-CM | POA: Diagnosis not present

## 2019-04-21 DIAGNOSIS — G309 Alzheimer's disease, unspecified: Secondary | ICD-10-CM | POA: Diagnosis not present

## 2019-04-21 DIAGNOSIS — M6281 Muscle weakness (generalized): Secondary | ICD-10-CM | POA: Diagnosis not present

## 2019-04-21 DIAGNOSIS — R4701 Aphasia: Secondary | ICD-10-CM | POA: Diagnosis not present

## 2019-04-22 DIAGNOSIS — M6281 Muscle weakness (generalized): Secondary | ICD-10-CM | POA: Diagnosis not present

## 2019-04-22 DIAGNOSIS — G309 Alzheimer's disease, unspecified: Secondary | ICD-10-CM | POA: Diagnosis not present

## 2019-04-22 DIAGNOSIS — R4701 Aphasia: Secondary | ICD-10-CM | POA: Diagnosis not present

## 2019-04-22 DIAGNOSIS — R41841 Cognitive communication deficit: Secondary | ICD-10-CM | POA: Diagnosis not present

## 2019-04-22 DIAGNOSIS — R279 Unspecified lack of coordination: Secondary | ICD-10-CM | POA: Diagnosis not present

## 2019-04-23 DIAGNOSIS — M6281 Muscle weakness (generalized): Secondary | ICD-10-CM | POA: Diagnosis not present

## 2019-04-23 DIAGNOSIS — R279 Unspecified lack of coordination: Secondary | ICD-10-CM | POA: Diagnosis not present

## 2019-04-23 DIAGNOSIS — G309 Alzheimer's disease, unspecified: Secondary | ICD-10-CM | POA: Diagnosis not present

## 2019-04-23 DIAGNOSIS — R4701 Aphasia: Secondary | ICD-10-CM | POA: Diagnosis not present

## 2019-04-23 DIAGNOSIS — R41841 Cognitive communication deficit: Secondary | ICD-10-CM | POA: Diagnosis not present

## 2019-04-24 DIAGNOSIS — M6281 Muscle weakness (generalized): Secondary | ICD-10-CM | POA: Diagnosis not present

## 2019-04-24 DIAGNOSIS — R279 Unspecified lack of coordination: Secondary | ICD-10-CM | POA: Diagnosis not present

## 2019-04-24 DIAGNOSIS — G309 Alzheimer's disease, unspecified: Secondary | ICD-10-CM | POA: Diagnosis not present

## 2019-04-24 DIAGNOSIS — R4701 Aphasia: Secondary | ICD-10-CM | POA: Diagnosis not present

## 2019-04-24 DIAGNOSIS — R41841 Cognitive communication deficit: Secondary | ICD-10-CM | POA: Diagnosis not present

## 2019-04-25 DIAGNOSIS — R41841 Cognitive communication deficit: Secondary | ICD-10-CM | POA: Diagnosis not present

## 2019-04-25 DIAGNOSIS — M6281 Muscle weakness (generalized): Secondary | ICD-10-CM | POA: Diagnosis not present

## 2019-04-25 DIAGNOSIS — R279 Unspecified lack of coordination: Secondary | ICD-10-CM | POA: Diagnosis not present

## 2019-04-25 DIAGNOSIS — G309 Alzheimer's disease, unspecified: Secondary | ICD-10-CM | POA: Diagnosis not present

## 2019-04-25 DIAGNOSIS — R4701 Aphasia: Secondary | ICD-10-CM | POA: Diagnosis not present

## 2019-04-28 DIAGNOSIS — R4701 Aphasia: Secondary | ICD-10-CM | POA: Diagnosis not present

## 2019-04-28 DIAGNOSIS — M6281 Muscle weakness (generalized): Secondary | ICD-10-CM | POA: Diagnosis not present

## 2019-04-28 DIAGNOSIS — R41841 Cognitive communication deficit: Secondary | ICD-10-CM | POA: Diagnosis not present

## 2019-04-28 DIAGNOSIS — G309 Alzheimer's disease, unspecified: Secondary | ICD-10-CM | POA: Diagnosis not present

## 2019-04-28 DIAGNOSIS — R279 Unspecified lack of coordination: Secondary | ICD-10-CM | POA: Diagnosis not present

## 2019-04-28 DIAGNOSIS — Z20828 Contact with and (suspected) exposure to other viral communicable diseases: Secondary | ICD-10-CM | POA: Diagnosis not present

## 2019-04-29 DIAGNOSIS — R4701 Aphasia: Secondary | ICD-10-CM | POA: Diagnosis not present

## 2019-04-29 DIAGNOSIS — M6281 Muscle weakness (generalized): Secondary | ICD-10-CM | POA: Diagnosis not present

## 2019-04-29 DIAGNOSIS — G309 Alzheimer's disease, unspecified: Secondary | ICD-10-CM | POA: Diagnosis not present

## 2019-04-29 DIAGNOSIS — R279 Unspecified lack of coordination: Secondary | ICD-10-CM | POA: Diagnosis not present

## 2019-04-29 DIAGNOSIS — R41841 Cognitive communication deficit: Secondary | ICD-10-CM | POA: Diagnosis not present

## 2019-04-30 DIAGNOSIS — Z593 Problems related to living in residential institution: Secondary | ICD-10-CM | POA: Diagnosis not present

## 2019-04-30 DIAGNOSIS — E538 Deficiency of other specified B group vitamins: Secondary | ICD-10-CM | POA: Diagnosis not present

## 2019-04-30 DIAGNOSIS — R279 Unspecified lack of coordination: Secondary | ICD-10-CM | POA: Diagnosis not present

## 2019-04-30 DIAGNOSIS — R4701 Aphasia: Secondary | ICD-10-CM | POA: Diagnosis not present

## 2019-04-30 DIAGNOSIS — R41841 Cognitive communication deficit: Secondary | ICD-10-CM | POA: Diagnosis not present

## 2019-04-30 DIAGNOSIS — G309 Alzheimer's disease, unspecified: Secondary | ICD-10-CM | POA: Diagnosis not present

## 2019-04-30 DIAGNOSIS — F0390 Unspecified dementia without behavioral disturbance: Secondary | ICD-10-CM | POA: Diagnosis not present

## 2019-04-30 DIAGNOSIS — K5900 Constipation, unspecified: Secondary | ICD-10-CM | POA: Diagnosis not present

## 2019-04-30 DIAGNOSIS — M6281 Muscle weakness (generalized): Secondary | ICD-10-CM | POA: Diagnosis not present

## 2019-05-02 ENCOUNTER — Ambulatory Visit (INDEPENDENT_AMBULATORY_CARE_PROVIDER_SITE_OTHER): Payer: Medicare Other | Admitting: Urology

## 2019-05-02 ENCOUNTER — Other Ambulatory Visit: Payer: Self-pay

## 2019-05-02 ENCOUNTER — Encounter: Payer: Self-pay | Admitting: Urology

## 2019-05-02 VITALS — BP 112/70 | HR 105 | Ht 60.0 in | Wt 118.0 lb

## 2019-05-02 DIAGNOSIS — R3121 Asymptomatic microscopic hematuria: Secondary | ICD-10-CM | POA: Diagnosis not present

## 2019-05-02 DIAGNOSIS — N281 Cyst of kidney, acquired: Secondary | ICD-10-CM

## 2019-05-02 DIAGNOSIS — N200 Calculus of kidney: Secondary | ICD-10-CM | POA: Diagnosis not present

## 2019-05-02 DIAGNOSIS — R279 Unspecified lack of coordination: Secondary | ICD-10-CM | POA: Diagnosis not present

## 2019-05-02 DIAGNOSIS — R41841 Cognitive communication deficit: Secondary | ICD-10-CM | POA: Diagnosis not present

## 2019-05-02 DIAGNOSIS — M6281 Muscle weakness (generalized): Secondary | ICD-10-CM | POA: Diagnosis not present

## 2019-05-02 DIAGNOSIS — R4701 Aphasia: Secondary | ICD-10-CM | POA: Diagnosis not present

## 2019-05-02 DIAGNOSIS — G309 Alzheimer's disease, unspecified: Secondary | ICD-10-CM | POA: Diagnosis not present

## 2019-05-02 LAB — URINALYSIS, COMPLETE
Bilirubin, UA: NEGATIVE
Glucose, UA: NEGATIVE
Ketones, UA: NEGATIVE
Nitrite, UA: NEGATIVE
Protein,UA: NEGATIVE
Specific Gravity, UA: 1.025 (ref 1.005–1.030)
Urobilinogen, Ur: 0.2 mg/dL (ref 0.2–1.0)
pH, UA: 5 (ref 5.0–7.5)

## 2019-05-02 LAB — MICROSCOPIC EXAMINATION

## 2019-05-02 NOTE — Patient Instructions (Signed)

## 2019-05-02 NOTE — Progress Notes (Signed)
   05/02/19  CC:  Chief Complaint  Patient presents with  . Cysto    HPI:  F/u - microhematuria -- Non-smoker. No chemo or XRT. No chemical exposure. She had a urinalysis August 2019 with 7-10 red blood cells and then 0-5 on 12/19/2018. No voiding complaints. CT benign 03/21/2019 with left upper pole 3 mm stone likely in a small calyceal diverticulum. Small bilateral cysts.   I reviewed the CT images and discussed with patient.    There were no vitals taken for this visit. NED. A&Ox3.   No respiratory distress   Abd soft, NT, ND Normal external genitalia with patent urethral meatus Vaginal atrophy No prolapse Chaperone - Carrie - exam and cysto   Cystoscopy Procedure Note  Patient identification was confirmed, informed consent was obtained, and patient was prepped using Betadine solution.  Lidocaine jelly was administered per urethral meatus.    Procedure: - Flexible cystoscope introduced, without any difficulty.   - Thorough search of the bladder revealed:    normal urethral meatus    normal urothelium    no stones    no ulcers     no tumors    no urethral polyps    no trabeculation  - Ureteral orifices were normal in position and appearance.  Post-Procedure: - Patient tolerated the procedure well  Assessment/ Plan:  Benign eval for microhematuria. Discussed management of small renal cysts and stone. [place] in 1 year.    No follow-ups on file.  Festus Aloe, MD

## 2019-05-05 DIAGNOSIS — R41841 Cognitive communication deficit: Secondary | ICD-10-CM | POA: Diagnosis not present

## 2019-05-05 DIAGNOSIS — R4701 Aphasia: Secondary | ICD-10-CM | POA: Diagnosis not present

## 2019-05-05 DIAGNOSIS — G309 Alzheimer's disease, unspecified: Secondary | ICD-10-CM | POA: Diagnosis not present

## 2019-05-05 DIAGNOSIS — R279 Unspecified lack of coordination: Secondary | ICD-10-CM | POA: Diagnosis not present

## 2019-05-05 DIAGNOSIS — M6281 Muscle weakness (generalized): Secondary | ICD-10-CM | POA: Diagnosis not present

## 2019-05-08 DIAGNOSIS — M6281 Muscle weakness (generalized): Secondary | ICD-10-CM | POA: Diagnosis not present

## 2019-05-08 DIAGNOSIS — Z20828 Contact with and (suspected) exposure to other viral communicable diseases: Secondary | ICD-10-CM | POA: Diagnosis not present

## 2019-05-08 DIAGNOSIS — R279 Unspecified lack of coordination: Secondary | ICD-10-CM | POA: Diagnosis not present

## 2019-05-08 DIAGNOSIS — R41841 Cognitive communication deficit: Secondary | ICD-10-CM | POA: Diagnosis not present

## 2019-05-08 DIAGNOSIS — R4701 Aphasia: Secondary | ICD-10-CM | POA: Diagnosis not present

## 2019-05-08 DIAGNOSIS — G309 Alzheimer's disease, unspecified: Secondary | ICD-10-CM | POA: Diagnosis not present

## 2019-05-12 DIAGNOSIS — H8110 Benign paroxysmal vertigo, unspecified ear: Secondary | ICD-10-CM | POA: Diagnosis not present

## 2019-05-12 DIAGNOSIS — R279 Unspecified lack of coordination: Secondary | ICD-10-CM | POA: Diagnosis not present

## 2019-05-12 DIAGNOSIS — K219 Gastro-esophageal reflux disease without esophagitis: Secondary | ICD-10-CM | POA: Diagnosis not present

## 2019-05-12 DIAGNOSIS — E538 Deficiency of other specified B group vitamins: Secondary | ICD-10-CM | POA: Diagnosis not present

## 2019-05-12 DIAGNOSIS — F0280 Dementia in other diseases classified elsewhere without behavioral disturbance: Secondary | ICD-10-CM | POA: Diagnosis not present

## 2019-05-12 DIAGNOSIS — M1612 Unilateral primary osteoarthritis, left hip: Secondary | ICD-10-CM | POA: Diagnosis not present

## 2019-05-12 DIAGNOSIS — M6281 Muscle weakness (generalized): Secondary | ICD-10-CM | POA: Diagnosis not present

## 2019-05-12 DIAGNOSIS — R41841 Cognitive communication deficit: Secondary | ICD-10-CM | POA: Diagnosis not present

## 2019-05-12 DIAGNOSIS — R4701 Aphasia: Secondary | ICD-10-CM | POA: Diagnosis not present

## 2019-05-13 DIAGNOSIS — Z20828 Contact with and (suspected) exposure to other viral communicable diseases: Secondary | ICD-10-CM | POA: Diagnosis not present

## 2019-05-13 DIAGNOSIS — R41841 Cognitive communication deficit: Secondary | ICD-10-CM | POA: Diagnosis not present

## 2019-05-13 DIAGNOSIS — F0280 Dementia in other diseases classified elsewhere without behavioral disturbance: Secondary | ICD-10-CM | POA: Diagnosis not present

## 2019-05-13 DIAGNOSIS — E538 Deficiency of other specified B group vitamins: Secondary | ICD-10-CM | POA: Diagnosis not present

## 2019-05-13 DIAGNOSIS — M6281 Muscle weakness (generalized): Secondary | ICD-10-CM | POA: Diagnosis not present

## 2019-05-13 DIAGNOSIS — R4701 Aphasia: Secondary | ICD-10-CM | POA: Diagnosis not present

## 2019-05-13 DIAGNOSIS — R279 Unspecified lack of coordination: Secondary | ICD-10-CM | POA: Diagnosis not present

## 2019-05-14 DIAGNOSIS — R279 Unspecified lack of coordination: Secondary | ICD-10-CM | POA: Diagnosis not present

## 2019-05-14 DIAGNOSIS — E538 Deficiency of other specified B group vitamins: Secondary | ICD-10-CM | POA: Diagnosis not present

## 2019-05-14 DIAGNOSIS — R41841 Cognitive communication deficit: Secondary | ICD-10-CM | POA: Diagnosis not present

## 2019-05-14 DIAGNOSIS — F0280 Dementia in other diseases classified elsewhere without behavioral disturbance: Secondary | ICD-10-CM | POA: Diagnosis not present

## 2019-05-14 DIAGNOSIS — M6281 Muscle weakness (generalized): Secondary | ICD-10-CM | POA: Diagnosis not present

## 2019-05-14 DIAGNOSIS — R4701 Aphasia: Secondary | ICD-10-CM | POA: Diagnosis not present

## 2019-05-15 DIAGNOSIS — R41841 Cognitive communication deficit: Secondary | ICD-10-CM | POA: Diagnosis not present

## 2019-05-15 DIAGNOSIS — E538 Deficiency of other specified B group vitamins: Secondary | ICD-10-CM | POA: Diagnosis not present

## 2019-05-15 DIAGNOSIS — F0280 Dementia in other diseases classified elsewhere without behavioral disturbance: Secondary | ICD-10-CM | POA: Diagnosis not present

## 2019-05-15 DIAGNOSIS — R4701 Aphasia: Secondary | ICD-10-CM | POA: Diagnosis not present

## 2019-05-15 DIAGNOSIS — R279 Unspecified lack of coordination: Secondary | ICD-10-CM | POA: Diagnosis not present

## 2019-05-15 DIAGNOSIS — M6281 Muscle weakness (generalized): Secondary | ICD-10-CM | POA: Diagnosis not present

## 2019-05-16 DIAGNOSIS — E538 Deficiency of other specified B group vitamins: Secondary | ICD-10-CM | POA: Diagnosis not present

## 2019-05-16 DIAGNOSIS — R4701 Aphasia: Secondary | ICD-10-CM | POA: Diagnosis not present

## 2019-05-16 DIAGNOSIS — M6281 Muscle weakness (generalized): Secondary | ICD-10-CM | POA: Diagnosis not present

## 2019-05-16 DIAGNOSIS — Z23 Encounter for immunization: Secondary | ICD-10-CM | POA: Diagnosis not present

## 2019-05-16 DIAGNOSIS — R41841 Cognitive communication deficit: Secondary | ICD-10-CM | POA: Diagnosis not present

## 2019-05-16 DIAGNOSIS — F0280 Dementia in other diseases classified elsewhere without behavioral disturbance: Secondary | ICD-10-CM | POA: Diagnosis not present

## 2019-05-16 DIAGNOSIS — R279 Unspecified lack of coordination: Secondary | ICD-10-CM | POA: Diagnosis not present

## 2019-05-19 DIAGNOSIS — R279 Unspecified lack of coordination: Secondary | ICD-10-CM | POA: Diagnosis not present

## 2019-05-19 DIAGNOSIS — R41841 Cognitive communication deficit: Secondary | ICD-10-CM | POA: Diagnosis not present

## 2019-05-19 DIAGNOSIS — R4701 Aphasia: Secondary | ICD-10-CM | POA: Diagnosis not present

## 2019-05-19 DIAGNOSIS — F0280 Dementia in other diseases classified elsewhere without behavioral disturbance: Secondary | ICD-10-CM | POA: Diagnosis not present

## 2019-05-19 DIAGNOSIS — E538 Deficiency of other specified B group vitamins: Secondary | ICD-10-CM | POA: Diagnosis not present

## 2019-05-19 DIAGNOSIS — Z20828 Contact with and (suspected) exposure to other viral communicable diseases: Secondary | ICD-10-CM | POA: Diagnosis not present

## 2019-05-19 DIAGNOSIS — M6281 Muscle weakness (generalized): Secondary | ICD-10-CM | POA: Diagnosis not present

## 2019-05-20 DIAGNOSIS — R4701 Aphasia: Secondary | ICD-10-CM | POA: Diagnosis not present

## 2019-05-20 DIAGNOSIS — F0280 Dementia in other diseases classified elsewhere without behavioral disturbance: Secondary | ICD-10-CM | POA: Diagnosis not present

## 2019-05-20 DIAGNOSIS — E538 Deficiency of other specified B group vitamins: Secondary | ICD-10-CM | POA: Diagnosis not present

## 2019-05-20 DIAGNOSIS — R41841 Cognitive communication deficit: Secondary | ICD-10-CM | POA: Diagnosis not present

## 2019-05-20 DIAGNOSIS — M6281 Muscle weakness (generalized): Secondary | ICD-10-CM | POA: Diagnosis not present

## 2019-05-20 DIAGNOSIS — R279 Unspecified lack of coordination: Secondary | ICD-10-CM | POA: Diagnosis not present

## 2019-05-21 DIAGNOSIS — R279 Unspecified lack of coordination: Secondary | ICD-10-CM | POA: Diagnosis not present

## 2019-05-21 DIAGNOSIS — M6281 Muscle weakness (generalized): Secondary | ICD-10-CM | POA: Diagnosis not present

## 2019-05-21 DIAGNOSIS — E538 Deficiency of other specified B group vitamins: Secondary | ICD-10-CM | POA: Diagnosis not present

## 2019-05-21 DIAGNOSIS — F0280 Dementia in other diseases classified elsewhere without behavioral disturbance: Secondary | ICD-10-CM | POA: Diagnosis not present

## 2019-05-21 DIAGNOSIS — R4701 Aphasia: Secondary | ICD-10-CM | POA: Diagnosis not present

## 2019-05-21 DIAGNOSIS — R41841 Cognitive communication deficit: Secondary | ICD-10-CM | POA: Diagnosis not present

## 2019-05-22 DIAGNOSIS — R279 Unspecified lack of coordination: Secondary | ICD-10-CM | POA: Diagnosis not present

## 2019-05-22 DIAGNOSIS — E538 Deficiency of other specified B group vitamins: Secondary | ICD-10-CM | POA: Diagnosis not present

## 2019-05-22 DIAGNOSIS — F0280 Dementia in other diseases classified elsewhere without behavioral disturbance: Secondary | ICD-10-CM | POA: Diagnosis not present

## 2019-05-22 DIAGNOSIS — R4701 Aphasia: Secondary | ICD-10-CM | POA: Diagnosis not present

## 2019-05-22 DIAGNOSIS — M6281 Muscle weakness (generalized): Secondary | ICD-10-CM | POA: Diagnosis not present

## 2019-05-22 DIAGNOSIS — R41841 Cognitive communication deficit: Secondary | ICD-10-CM | POA: Diagnosis not present

## 2019-05-23 DIAGNOSIS — R279 Unspecified lack of coordination: Secondary | ICD-10-CM | POA: Diagnosis not present

## 2019-05-23 DIAGNOSIS — R4701 Aphasia: Secondary | ICD-10-CM | POA: Diagnosis not present

## 2019-05-23 DIAGNOSIS — R41841 Cognitive communication deficit: Secondary | ICD-10-CM | POA: Diagnosis not present

## 2019-05-23 DIAGNOSIS — M6281 Muscle weakness (generalized): Secondary | ICD-10-CM | POA: Diagnosis not present

## 2019-05-23 DIAGNOSIS — E538 Deficiency of other specified B group vitamins: Secondary | ICD-10-CM | POA: Diagnosis not present

## 2019-05-23 DIAGNOSIS — F0280 Dementia in other diseases classified elsewhere without behavioral disturbance: Secondary | ICD-10-CM | POA: Diagnosis not present

## 2019-05-27 DIAGNOSIS — M6281 Muscle weakness (generalized): Secondary | ICD-10-CM | POA: Diagnosis not present

## 2019-05-27 DIAGNOSIS — R279 Unspecified lack of coordination: Secondary | ICD-10-CM | POA: Diagnosis not present

## 2019-05-27 DIAGNOSIS — R4701 Aphasia: Secondary | ICD-10-CM | POA: Diagnosis not present

## 2019-05-27 DIAGNOSIS — R41841 Cognitive communication deficit: Secondary | ICD-10-CM | POA: Diagnosis not present

## 2019-05-27 DIAGNOSIS — E538 Deficiency of other specified B group vitamins: Secondary | ICD-10-CM | POA: Diagnosis not present

## 2019-05-27 DIAGNOSIS — F0280 Dementia in other diseases classified elsewhere without behavioral disturbance: Secondary | ICD-10-CM | POA: Diagnosis not present

## 2019-05-28 DIAGNOSIS — R41841 Cognitive communication deficit: Secondary | ICD-10-CM | POA: Diagnosis not present

## 2019-05-28 DIAGNOSIS — R4701 Aphasia: Secondary | ICD-10-CM | POA: Diagnosis not present

## 2019-05-28 DIAGNOSIS — M6281 Muscle weakness (generalized): Secondary | ICD-10-CM | POA: Diagnosis not present

## 2019-05-28 DIAGNOSIS — E538 Deficiency of other specified B group vitamins: Secondary | ICD-10-CM | POA: Diagnosis not present

## 2019-05-28 DIAGNOSIS — F0280 Dementia in other diseases classified elsewhere without behavioral disturbance: Secondary | ICD-10-CM | POA: Diagnosis not present

## 2019-05-28 DIAGNOSIS — R279 Unspecified lack of coordination: Secondary | ICD-10-CM | POA: Diagnosis not present

## 2019-05-29 ENCOUNTER — Encounter
Admission: RE | Admit: 2019-05-29 | Discharge: 2019-05-29 | Disposition: A | Discharge status: Home / Self Care | Payer: Medicare Other | Source: Ambulatory Visit | Attending: Internal Medicine | Admitting: Internal Medicine

## 2019-05-30 DIAGNOSIS — R4701 Aphasia: Secondary | ICD-10-CM | POA: Diagnosis not present

## 2019-05-30 DIAGNOSIS — R41841 Cognitive communication deficit: Secondary | ICD-10-CM | POA: Diagnosis not present

## 2019-05-30 DIAGNOSIS — F0280 Dementia in other diseases classified elsewhere without behavioral disturbance: Secondary | ICD-10-CM | POA: Diagnosis not present

## 2019-05-30 DIAGNOSIS — R279 Unspecified lack of coordination: Secondary | ICD-10-CM | POA: Diagnosis not present

## 2019-05-30 DIAGNOSIS — M6281 Muscle weakness (generalized): Secondary | ICD-10-CM | POA: Diagnosis not present

## 2019-05-30 DIAGNOSIS — E538 Deficiency of other specified B group vitamins: Secondary | ICD-10-CM | POA: Diagnosis not present

## 2019-06-02 DIAGNOSIS — R279 Unspecified lack of coordination: Secondary | ICD-10-CM | POA: Diagnosis not present

## 2019-06-02 DIAGNOSIS — E538 Deficiency of other specified B group vitamins: Secondary | ICD-10-CM | POA: Diagnosis not present

## 2019-06-02 DIAGNOSIS — R4701 Aphasia: Secondary | ICD-10-CM | POA: Diagnosis not present

## 2019-06-02 DIAGNOSIS — R41841 Cognitive communication deficit: Secondary | ICD-10-CM | POA: Diagnosis not present

## 2019-06-02 DIAGNOSIS — M6281 Muscle weakness (generalized): Secondary | ICD-10-CM | POA: Diagnosis not present

## 2019-06-02 DIAGNOSIS — F0280 Dementia in other diseases classified elsewhere without behavioral disturbance: Secondary | ICD-10-CM | POA: Diagnosis not present

## 2019-06-03 DIAGNOSIS — F0280 Dementia in other diseases classified elsewhere without behavioral disturbance: Secondary | ICD-10-CM | POA: Diagnosis not present

## 2019-06-03 DIAGNOSIS — M6281 Muscle weakness (generalized): Secondary | ICD-10-CM | POA: Diagnosis not present

## 2019-06-03 DIAGNOSIS — R279 Unspecified lack of coordination: Secondary | ICD-10-CM | POA: Diagnosis not present

## 2019-06-03 DIAGNOSIS — R41841 Cognitive communication deficit: Secondary | ICD-10-CM | POA: Diagnosis not present

## 2019-06-03 DIAGNOSIS — E538 Deficiency of other specified B group vitamins: Secondary | ICD-10-CM | POA: Diagnosis not present

## 2019-06-03 DIAGNOSIS — R4701 Aphasia: Secondary | ICD-10-CM | POA: Diagnosis not present

## 2019-06-04 DIAGNOSIS — M6281 Muscle weakness (generalized): Secondary | ICD-10-CM | POA: Diagnosis not present

## 2019-06-04 DIAGNOSIS — R4701 Aphasia: Secondary | ICD-10-CM | POA: Diagnosis not present

## 2019-06-04 DIAGNOSIS — F0280 Dementia in other diseases classified elsewhere without behavioral disturbance: Secondary | ICD-10-CM | POA: Diagnosis not present

## 2019-06-04 DIAGNOSIS — R41841 Cognitive communication deficit: Secondary | ICD-10-CM | POA: Diagnosis not present

## 2019-06-04 DIAGNOSIS — R279 Unspecified lack of coordination: Secondary | ICD-10-CM | POA: Diagnosis not present

## 2019-06-04 DIAGNOSIS — E538 Deficiency of other specified B group vitamins: Secondary | ICD-10-CM | POA: Diagnosis not present

## 2019-06-05 DIAGNOSIS — R279 Unspecified lack of coordination: Secondary | ICD-10-CM | POA: Diagnosis not present

## 2019-06-05 DIAGNOSIS — F0280 Dementia in other diseases classified elsewhere without behavioral disturbance: Secondary | ICD-10-CM | POA: Diagnosis not present

## 2019-06-05 DIAGNOSIS — R41841 Cognitive communication deficit: Secondary | ICD-10-CM | POA: Diagnosis not present

## 2019-06-05 DIAGNOSIS — E538 Deficiency of other specified B group vitamins: Secondary | ICD-10-CM | POA: Diagnosis not present

## 2019-06-05 DIAGNOSIS — R4701 Aphasia: Secondary | ICD-10-CM | POA: Diagnosis not present

## 2019-06-05 DIAGNOSIS — M6281 Muscle weakness (generalized): Secondary | ICD-10-CM | POA: Diagnosis not present

## 2019-06-06 DIAGNOSIS — R4701 Aphasia: Secondary | ICD-10-CM | POA: Diagnosis not present

## 2019-06-06 DIAGNOSIS — R279 Unspecified lack of coordination: Secondary | ICD-10-CM | POA: Diagnosis not present

## 2019-06-06 DIAGNOSIS — R41841 Cognitive communication deficit: Secondary | ICD-10-CM | POA: Diagnosis not present

## 2019-06-06 DIAGNOSIS — E538 Deficiency of other specified B group vitamins: Secondary | ICD-10-CM | POA: Diagnosis not present

## 2019-06-06 DIAGNOSIS — F0280 Dementia in other diseases classified elsewhere without behavioral disturbance: Secondary | ICD-10-CM | POA: Diagnosis not present

## 2019-06-06 DIAGNOSIS — M6281 Muscle weakness (generalized): Secondary | ICD-10-CM | POA: Diagnosis not present

## 2019-06-09 DIAGNOSIS — H8110 Benign paroxysmal vertigo, unspecified ear: Secondary | ICD-10-CM | POA: Diagnosis not present

## 2019-06-09 DIAGNOSIS — E538 Deficiency of other specified B group vitamins: Secondary | ICD-10-CM | POA: Diagnosis not present

## 2019-06-09 DIAGNOSIS — R4701 Aphasia: Secondary | ICD-10-CM | POA: Diagnosis not present

## 2019-06-09 DIAGNOSIS — K219 Gastro-esophageal reflux disease without esophagitis: Secondary | ICD-10-CM | POA: Diagnosis not present

## 2019-06-09 DIAGNOSIS — M6281 Muscle weakness (generalized): Secondary | ICD-10-CM | POA: Diagnosis not present

## 2019-06-09 DIAGNOSIS — F0280 Dementia in other diseases classified elsewhere without behavioral disturbance: Secondary | ICD-10-CM | POA: Diagnosis not present

## 2019-06-09 DIAGNOSIS — R41841 Cognitive communication deficit: Secondary | ICD-10-CM | POA: Diagnosis not present

## 2019-06-09 DIAGNOSIS — R279 Unspecified lack of coordination: Secondary | ICD-10-CM | POA: Diagnosis not present

## 2019-06-09 DIAGNOSIS — M1612 Unilateral primary osteoarthritis, left hip: Secondary | ICD-10-CM | POA: Diagnosis not present

## 2019-06-11 DIAGNOSIS — R4701 Aphasia: Secondary | ICD-10-CM | POA: Diagnosis not present

## 2019-06-11 DIAGNOSIS — M6281 Muscle weakness (generalized): Secondary | ICD-10-CM | POA: Diagnosis not present

## 2019-06-11 DIAGNOSIS — R279 Unspecified lack of coordination: Secondary | ICD-10-CM | POA: Diagnosis not present

## 2019-06-11 DIAGNOSIS — F0280 Dementia in other diseases classified elsewhere without behavioral disturbance: Secondary | ICD-10-CM | POA: Diagnosis not present

## 2019-06-11 DIAGNOSIS — R41841 Cognitive communication deficit: Secondary | ICD-10-CM | POA: Diagnosis not present

## 2019-06-11 DIAGNOSIS — E538 Deficiency of other specified B group vitamins: Secondary | ICD-10-CM | POA: Diagnosis not present

## 2019-06-12 DIAGNOSIS — M6281 Muscle weakness (generalized): Secondary | ICD-10-CM | POA: Diagnosis not present

## 2019-06-12 DIAGNOSIS — F0280 Dementia in other diseases classified elsewhere without behavioral disturbance: Secondary | ICD-10-CM | POA: Diagnosis not present

## 2019-06-12 DIAGNOSIS — E538 Deficiency of other specified B group vitamins: Secondary | ICD-10-CM | POA: Diagnosis not present

## 2019-06-12 DIAGNOSIS — R41841 Cognitive communication deficit: Secondary | ICD-10-CM | POA: Diagnosis not present

## 2019-06-12 DIAGNOSIS — R4701 Aphasia: Secondary | ICD-10-CM | POA: Diagnosis not present

## 2019-06-12 DIAGNOSIS — R279 Unspecified lack of coordination: Secondary | ICD-10-CM | POA: Diagnosis not present

## 2019-06-16 DIAGNOSIS — R279 Unspecified lack of coordination: Secondary | ICD-10-CM | POA: Diagnosis not present

## 2019-06-16 DIAGNOSIS — E538 Deficiency of other specified B group vitamins: Secondary | ICD-10-CM | POA: Diagnosis not present

## 2019-06-16 DIAGNOSIS — R41841 Cognitive communication deficit: Secondary | ICD-10-CM | POA: Diagnosis not present

## 2019-06-16 DIAGNOSIS — F0280 Dementia in other diseases classified elsewhere without behavioral disturbance: Secondary | ICD-10-CM | POA: Diagnosis not present

## 2019-06-16 DIAGNOSIS — R4701 Aphasia: Secondary | ICD-10-CM | POA: Diagnosis not present

## 2019-06-16 DIAGNOSIS — M6281 Muscle weakness (generalized): Secondary | ICD-10-CM | POA: Diagnosis not present

## 2019-06-17 DIAGNOSIS — M6281 Muscle weakness (generalized): Secondary | ICD-10-CM | POA: Diagnosis not present

## 2019-06-17 DIAGNOSIS — R41841 Cognitive communication deficit: Secondary | ICD-10-CM | POA: Diagnosis not present

## 2019-06-17 DIAGNOSIS — R279 Unspecified lack of coordination: Secondary | ICD-10-CM | POA: Diagnosis not present

## 2019-06-17 DIAGNOSIS — E538 Deficiency of other specified B group vitamins: Secondary | ICD-10-CM | POA: Diagnosis not present

## 2019-06-17 DIAGNOSIS — R4701 Aphasia: Secondary | ICD-10-CM | POA: Diagnosis not present

## 2019-06-17 DIAGNOSIS — F0280 Dementia in other diseases classified elsewhere without behavioral disturbance: Secondary | ICD-10-CM | POA: Diagnosis not present

## 2019-06-19 DIAGNOSIS — E538 Deficiency of other specified B group vitamins: Secondary | ICD-10-CM | POA: Diagnosis not present

## 2019-06-19 DIAGNOSIS — R41841 Cognitive communication deficit: Secondary | ICD-10-CM | POA: Diagnosis not present

## 2019-06-19 DIAGNOSIS — R279 Unspecified lack of coordination: Secondary | ICD-10-CM | POA: Diagnosis not present

## 2019-06-19 DIAGNOSIS — F0280 Dementia in other diseases classified elsewhere without behavioral disturbance: Secondary | ICD-10-CM | POA: Diagnosis not present

## 2019-06-19 DIAGNOSIS — R4701 Aphasia: Secondary | ICD-10-CM | POA: Diagnosis not present

## 2019-06-19 DIAGNOSIS — M6281 Muscle weakness (generalized): Secondary | ICD-10-CM | POA: Diagnosis not present

## 2019-06-20 DIAGNOSIS — R4701 Aphasia: Secondary | ICD-10-CM | POA: Diagnosis not present

## 2019-06-20 DIAGNOSIS — F0280 Dementia in other diseases classified elsewhere without behavioral disturbance: Secondary | ICD-10-CM | POA: Diagnosis not present

## 2019-06-20 DIAGNOSIS — M6281 Muscle weakness (generalized): Secondary | ICD-10-CM | POA: Diagnosis not present

## 2019-06-20 DIAGNOSIS — R41841 Cognitive communication deficit: Secondary | ICD-10-CM | POA: Diagnosis not present

## 2019-06-20 DIAGNOSIS — E538 Deficiency of other specified B group vitamins: Secondary | ICD-10-CM | POA: Diagnosis not present

## 2019-06-20 DIAGNOSIS — R279 Unspecified lack of coordination: Secondary | ICD-10-CM | POA: Diagnosis not present

## 2019-06-23 ENCOUNTER — Other Ambulatory Visit
Admission: RE | Admit: 2019-06-23 | Discharge: 2019-06-23 | Disposition: A | Discharge status: Home / Self Care | Payer: Medicare Other | Source: Ambulatory Visit | Attending: Internal Medicine | Admitting: Internal Medicine

## 2019-06-23 DIAGNOSIS — E539 Vitamin B deficiency, unspecified: Secondary | ICD-10-CM | POA: Diagnosis not present

## 2019-06-23 DIAGNOSIS — R4701 Aphasia: Secondary | ICD-10-CM | POA: Diagnosis not present

## 2019-06-23 DIAGNOSIS — R279 Unspecified lack of coordination: Secondary | ICD-10-CM | POA: Diagnosis not present

## 2019-06-23 DIAGNOSIS — M6281 Muscle weakness (generalized): Secondary | ICD-10-CM | POA: Diagnosis not present

## 2019-06-23 DIAGNOSIS — R41841 Cognitive communication deficit: Secondary | ICD-10-CM | POA: Diagnosis not present

## 2019-06-23 DIAGNOSIS — F0280 Dementia in other diseases classified elsewhere without behavioral disturbance: Secondary | ICD-10-CM | POA: Diagnosis not present

## 2019-06-23 DIAGNOSIS — E538 Deficiency of other specified B group vitamins: Secondary | ICD-10-CM | POA: Diagnosis not present

## 2019-06-23 LAB — VITAMIN B12: Vitamin B-12: 730 pg/mL (ref 180–914)

## 2019-06-24 DIAGNOSIS — E538 Deficiency of other specified B group vitamins: Secondary | ICD-10-CM | POA: Diagnosis not present

## 2019-06-24 DIAGNOSIS — F0280 Dementia in other diseases classified elsewhere without behavioral disturbance: Secondary | ICD-10-CM | POA: Diagnosis not present

## 2019-06-24 DIAGNOSIS — R4701 Aphasia: Secondary | ICD-10-CM | POA: Diagnosis not present

## 2019-06-24 DIAGNOSIS — R41841 Cognitive communication deficit: Secondary | ICD-10-CM | POA: Diagnosis not present

## 2019-06-24 DIAGNOSIS — M6281 Muscle weakness (generalized): Secondary | ICD-10-CM | POA: Diagnosis not present

## 2019-06-24 DIAGNOSIS — R279 Unspecified lack of coordination: Secondary | ICD-10-CM | POA: Diagnosis not present

## 2019-06-25 DIAGNOSIS — F0280 Dementia in other diseases classified elsewhere without behavioral disturbance: Secondary | ICD-10-CM | POA: Diagnosis not present

## 2019-06-25 DIAGNOSIS — R4701 Aphasia: Secondary | ICD-10-CM | POA: Diagnosis not present

## 2019-06-25 DIAGNOSIS — R41841 Cognitive communication deficit: Secondary | ICD-10-CM | POA: Diagnosis not present

## 2019-06-25 DIAGNOSIS — M6281 Muscle weakness (generalized): Secondary | ICD-10-CM | POA: Diagnosis not present

## 2019-06-25 DIAGNOSIS — R279 Unspecified lack of coordination: Secondary | ICD-10-CM | POA: Diagnosis not present

## 2019-06-25 DIAGNOSIS — E538 Deficiency of other specified B group vitamins: Secondary | ICD-10-CM | POA: Diagnosis not present

## 2019-06-26 DIAGNOSIS — F0280 Dementia in other diseases classified elsewhere without behavioral disturbance: Secondary | ICD-10-CM | POA: Diagnosis not present

## 2019-06-26 DIAGNOSIS — R4701 Aphasia: Secondary | ICD-10-CM | POA: Diagnosis not present

## 2019-06-26 DIAGNOSIS — E538 Deficiency of other specified B group vitamins: Secondary | ICD-10-CM | POA: Diagnosis not present

## 2019-06-26 DIAGNOSIS — M6281 Muscle weakness (generalized): Secondary | ICD-10-CM | POA: Diagnosis not present

## 2019-06-26 DIAGNOSIS — R279 Unspecified lack of coordination: Secondary | ICD-10-CM | POA: Diagnosis not present

## 2019-06-26 DIAGNOSIS — R41841 Cognitive communication deficit: Secondary | ICD-10-CM | POA: Diagnosis not present

## 2019-06-27 ENCOUNTER — Telehealth: Payer: Self-pay | Admitting: Family Medicine

## 2019-06-27 DIAGNOSIS — R279 Unspecified lack of coordination: Secondary | ICD-10-CM | POA: Diagnosis not present

## 2019-06-27 DIAGNOSIS — E538 Deficiency of other specified B group vitamins: Secondary | ICD-10-CM | POA: Diagnosis not present

## 2019-06-27 DIAGNOSIS — F0280 Dementia in other diseases classified elsewhere without behavioral disturbance: Secondary | ICD-10-CM | POA: Diagnosis not present

## 2019-06-27 DIAGNOSIS — R4701 Aphasia: Secondary | ICD-10-CM | POA: Diagnosis not present

## 2019-06-27 DIAGNOSIS — R41841 Cognitive communication deficit: Secondary | ICD-10-CM | POA: Diagnosis not present

## 2019-06-27 DIAGNOSIS — M6281 Muscle weakness (generalized): Secondary | ICD-10-CM | POA: Diagnosis not present

## 2019-06-27 NOTE — Telephone Encounter (Signed)
Left message for patient to call back and schedule Medicare Annual Wellness Visit (AWV) either virtually or audio only.  Last AWV 10/30/17 ; please schedule at anytime with Denisa O'Brien-Blaney at [facility].

## 2019-06-30 DIAGNOSIS — F0280 Dementia in other diseases classified elsewhere without behavioral disturbance: Secondary | ICD-10-CM | POA: Diagnosis not present

## 2019-06-30 DIAGNOSIS — E538 Deficiency of other specified B group vitamins: Secondary | ICD-10-CM | POA: Diagnosis not present

## 2019-06-30 DIAGNOSIS — M6281 Muscle weakness (generalized): Secondary | ICD-10-CM | POA: Diagnosis not present

## 2019-06-30 DIAGNOSIS — R41841 Cognitive communication deficit: Secondary | ICD-10-CM | POA: Diagnosis not present

## 2019-06-30 DIAGNOSIS — R4701 Aphasia: Secondary | ICD-10-CM | POA: Diagnosis not present

## 2019-06-30 DIAGNOSIS — R279 Unspecified lack of coordination: Secondary | ICD-10-CM | POA: Diagnosis not present

## 2019-07-01 DIAGNOSIS — R4701 Aphasia: Secondary | ICD-10-CM | POA: Diagnosis not present

## 2019-07-01 DIAGNOSIS — F0280 Dementia in other diseases classified elsewhere without behavioral disturbance: Secondary | ICD-10-CM | POA: Diagnosis not present

## 2019-07-01 DIAGNOSIS — E538 Deficiency of other specified B group vitamins: Secondary | ICD-10-CM | POA: Diagnosis not present

## 2019-07-01 DIAGNOSIS — R41841 Cognitive communication deficit: Secondary | ICD-10-CM | POA: Diagnosis not present

## 2019-07-01 DIAGNOSIS — M6281 Muscle weakness (generalized): Secondary | ICD-10-CM | POA: Diagnosis not present

## 2019-07-01 DIAGNOSIS — R279 Unspecified lack of coordination: Secondary | ICD-10-CM | POA: Diagnosis not present

## 2019-07-02 DIAGNOSIS — R41841 Cognitive communication deficit: Secondary | ICD-10-CM | POA: Diagnosis not present

## 2019-07-02 DIAGNOSIS — F0280 Dementia in other diseases classified elsewhere without behavioral disturbance: Secondary | ICD-10-CM | POA: Diagnosis not present

## 2019-07-02 DIAGNOSIS — M6281 Muscle weakness (generalized): Secondary | ICD-10-CM | POA: Diagnosis not present

## 2019-07-02 DIAGNOSIS — R4701 Aphasia: Secondary | ICD-10-CM | POA: Diagnosis not present

## 2019-07-02 DIAGNOSIS — R279 Unspecified lack of coordination: Secondary | ICD-10-CM | POA: Diagnosis not present

## 2019-07-02 DIAGNOSIS — E538 Deficiency of other specified B group vitamins: Secondary | ICD-10-CM | POA: Diagnosis not present

## 2019-07-04 DIAGNOSIS — R41841 Cognitive communication deficit: Secondary | ICD-10-CM | POA: Diagnosis not present

## 2019-07-04 DIAGNOSIS — M6281 Muscle weakness (generalized): Secondary | ICD-10-CM | POA: Diagnosis not present

## 2019-07-04 DIAGNOSIS — R4701 Aphasia: Secondary | ICD-10-CM | POA: Diagnosis not present

## 2019-07-04 DIAGNOSIS — E538 Deficiency of other specified B group vitamins: Secondary | ICD-10-CM | POA: Diagnosis not present

## 2019-07-04 DIAGNOSIS — F0280 Dementia in other diseases classified elsewhere without behavioral disturbance: Secondary | ICD-10-CM | POA: Diagnosis not present

## 2019-07-04 DIAGNOSIS — R279 Unspecified lack of coordination: Secondary | ICD-10-CM | POA: Diagnosis not present

## 2019-07-07 DIAGNOSIS — E538 Deficiency of other specified B group vitamins: Secondary | ICD-10-CM | POA: Diagnosis not present

## 2019-07-07 DIAGNOSIS — F0280 Dementia in other diseases classified elsewhere without behavioral disturbance: Secondary | ICD-10-CM | POA: Diagnosis not present

## 2019-07-07 DIAGNOSIS — R41841 Cognitive communication deficit: Secondary | ICD-10-CM | POA: Diagnosis not present

## 2019-07-07 DIAGNOSIS — R279 Unspecified lack of coordination: Secondary | ICD-10-CM | POA: Diagnosis not present

## 2019-07-07 DIAGNOSIS — R4701 Aphasia: Secondary | ICD-10-CM | POA: Diagnosis not present

## 2019-07-07 DIAGNOSIS — M6281 Muscle weakness (generalized): Secondary | ICD-10-CM | POA: Diagnosis not present

## 2019-07-08 DIAGNOSIS — R41841 Cognitive communication deficit: Secondary | ICD-10-CM | POA: Diagnosis not present

## 2019-07-08 DIAGNOSIS — R279 Unspecified lack of coordination: Secondary | ICD-10-CM | POA: Diagnosis not present

## 2019-07-08 DIAGNOSIS — M6281 Muscle weakness (generalized): Secondary | ICD-10-CM | POA: Diagnosis not present

## 2019-07-08 DIAGNOSIS — F0280 Dementia in other diseases classified elsewhere without behavioral disturbance: Secondary | ICD-10-CM | POA: Diagnosis not present

## 2019-07-08 DIAGNOSIS — R4701 Aphasia: Secondary | ICD-10-CM | POA: Diagnosis not present

## 2019-07-08 DIAGNOSIS — E538 Deficiency of other specified B group vitamins: Secondary | ICD-10-CM | POA: Diagnosis not present

## 2019-07-09 DIAGNOSIS — M6281 Muscle weakness (generalized): Secondary | ICD-10-CM | POA: Diagnosis not present

## 2019-07-09 DIAGNOSIS — F0280 Dementia in other diseases classified elsewhere without behavioral disturbance: Secondary | ICD-10-CM | POA: Diagnosis not present

## 2019-07-09 DIAGNOSIS — R4701 Aphasia: Secondary | ICD-10-CM | POA: Diagnosis not present

## 2019-07-09 DIAGNOSIS — E538 Deficiency of other specified B group vitamins: Secondary | ICD-10-CM | POA: Diagnosis not present

## 2019-07-09 DIAGNOSIS — R41841 Cognitive communication deficit: Secondary | ICD-10-CM | POA: Diagnosis not present

## 2019-07-09 DIAGNOSIS — R279 Unspecified lack of coordination: Secondary | ICD-10-CM | POA: Diagnosis not present

## 2019-07-10 DIAGNOSIS — M6281 Muscle weakness (generalized): Secondary | ICD-10-CM | POA: Diagnosis not present

## 2019-07-10 DIAGNOSIS — R279 Unspecified lack of coordination: Secondary | ICD-10-CM | POA: Diagnosis not present

## 2019-07-10 DIAGNOSIS — G309 Alzheimer's disease, unspecified: Secondary | ICD-10-CM | POA: Diagnosis not present

## 2019-07-14 DIAGNOSIS — M6281 Muscle weakness (generalized): Secondary | ICD-10-CM | POA: Diagnosis not present

## 2019-07-14 DIAGNOSIS — R279 Unspecified lack of coordination: Secondary | ICD-10-CM | POA: Diagnosis not present

## 2019-07-14 DIAGNOSIS — G309 Alzheimer's disease, unspecified: Secondary | ICD-10-CM | POA: Diagnosis not present

## 2019-07-15 DIAGNOSIS — M6281 Muscle weakness (generalized): Secondary | ICD-10-CM | POA: Diagnosis not present

## 2019-07-15 DIAGNOSIS — G309 Alzheimer's disease, unspecified: Secondary | ICD-10-CM | POA: Diagnosis not present

## 2019-07-15 DIAGNOSIS — R279 Unspecified lack of coordination: Secondary | ICD-10-CM | POA: Diagnosis not present

## 2019-07-17 DIAGNOSIS — R279 Unspecified lack of coordination: Secondary | ICD-10-CM | POA: Diagnosis not present

## 2019-07-17 DIAGNOSIS — G309 Alzheimer's disease, unspecified: Secondary | ICD-10-CM | POA: Diagnosis not present

## 2019-07-17 DIAGNOSIS — M6281 Muscle weakness (generalized): Secondary | ICD-10-CM | POA: Diagnosis not present

## 2019-07-21 DIAGNOSIS — M6281 Muscle weakness (generalized): Secondary | ICD-10-CM | POA: Diagnosis not present

## 2019-07-21 DIAGNOSIS — R279 Unspecified lack of coordination: Secondary | ICD-10-CM | POA: Diagnosis not present

## 2019-07-21 DIAGNOSIS — G309 Alzheimer's disease, unspecified: Secondary | ICD-10-CM | POA: Diagnosis not present

## 2019-07-22 DIAGNOSIS — M6281 Muscle weakness (generalized): Secondary | ICD-10-CM | POA: Diagnosis not present

## 2019-07-22 DIAGNOSIS — R279 Unspecified lack of coordination: Secondary | ICD-10-CM | POA: Diagnosis not present

## 2019-07-22 DIAGNOSIS — G309 Alzheimer's disease, unspecified: Secondary | ICD-10-CM | POA: Diagnosis not present

## 2019-07-24 DIAGNOSIS — R279 Unspecified lack of coordination: Secondary | ICD-10-CM | POA: Diagnosis not present

## 2019-07-24 DIAGNOSIS — G309 Alzheimer's disease, unspecified: Secondary | ICD-10-CM | POA: Diagnosis not present

## 2019-07-24 DIAGNOSIS — M6281 Muscle weakness (generalized): Secondary | ICD-10-CM | POA: Diagnosis not present

## 2019-07-28 DIAGNOSIS — M6281 Muscle weakness (generalized): Secondary | ICD-10-CM | POA: Diagnosis not present

## 2019-07-28 DIAGNOSIS — G309 Alzheimer's disease, unspecified: Secondary | ICD-10-CM | POA: Diagnosis not present

## 2019-07-28 DIAGNOSIS — R279 Unspecified lack of coordination: Secondary | ICD-10-CM | POA: Diagnosis not present

## 2019-07-31 DIAGNOSIS — G309 Alzheimer's disease, unspecified: Secondary | ICD-10-CM | POA: Diagnosis not present

## 2019-07-31 DIAGNOSIS — R279 Unspecified lack of coordination: Secondary | ICD-10-CM | POA: Diagnosis not present

## 2019-07-31 DIAGNOSIS — M6281 Muscle weakness (generalized): Secondary | ICD-10-CM | POA: Diagnosis not present

## 2019-08-01 DIAGNOSIS — M6281 Muscle weakness (generalized): Secondary | ICD-10-CM | POA: Diagnosis not present

## 2019-08-01 DIAGNOSIS — G309 Alzheimer's disease, unspecified: Secondary | ICD-10-CM | POA: Diagnosis not present

## 2019-08-01 DIAGNOSIS — R279 Unspecified lack of coordination: Secondary | ICD-10-CM | POA: Diagnosis not present

## 2019-08-04 DIAGNOSIS — R279 Unspecified lack of coordination: Secondary | ICD-10-CM | POA: Diagnosis not present

## 2019-08-04 DIAGNOSIS — G309 Alzheimer's disease, unspecified: Secondary | ICD-10-CM | POA: Diagnosis not present

## 2019-08-04 DIAGNOSIS — M6281 Muscle weakness (generalized): Secondary | ICD-10-CM | POA: Diagnosis not present

## 2019-08-05 DIAGNOSIS — M6281 Muscle weakness (generalized): Secondary | ICD-10-CM | POA: Diagnosis not present

## 2019-08-05 DIAGNOSIS — R279 Unspecified lack of coordination: Secondary | ICD-10-CM | POA: Diagnosis not present

## 2019-08-05 DIAGNOSIS — G309 Alzheimer's disease, unspecified: Secondary | ICD-10-CM | POA: Diagnosis not present

## 2019-08-08 DIAGNOSIS — R279 Unspecified lack of coordination: Secondary | ICD-10-CM | POA: Diagnosis not present

## 2019-08-08 DIAGNOSIS — M6281 Muscle weakness (generalized): Secondary | ICD-10-CM | POA: Diagnosis not present

## 2019-08-08 DIAGNOSIS — G309 Alzheimer's disease, unspecified: Secondary | ICD-10-CM | POA: Diagnosis not present

## 2019-08-11 ENCOUNTER — Encounter
Admission: RE | Admit: 2019-08-11 | Discharge: 2019-08-11 | Disposition: A | Discharge status: Home / Self Care | Payer: Medicare Other | Source: Ambulatory Visit | Attending: Internal Medicine | Admitting: Internal Medicine

## 2019-08-11 DIAGNOSIS — M1612 Unilateral primary osteoarthritis, left hip: Secondary | ICD-10-CM | POA: Diagnosis not present

## 2019-08-11 DIAGNOSIS — K219 Gastro-esophageal reflux disease without esophagitis: Secondary | ICD-10-CM | POA: Diagnosis not present

## 2019-08-11 DIAGNOSIS — R41841 Cognitive communication deficit: Secondary | ICD-10-CM | POA: Diagnosis not present

## 2019-08-11 DIAGNOSIS — E538 Deficiency of other specified B group vitamins: Secondary | ICD-10-CM | POA: Diagnosis not present

## 2019-08-11 DIAGNOSIS — F0280 Dementia in other diseases classified elsewhere without behavioral disturbance: Secondary | ICD-10-CM | POA: Diagnosis not present

## 2019-08-11 DIAGNOSIS — M6281 Muscle weakness (generalized): Secondary | ICD-10-CM | POA: Diagnosis not present

## 2019-08-11 DIAGNOSIS — R4701 Aphasia: Secondary | ICD-10-CM | POA: Diagnosis not present

## 2019-08-11 DIAGNOSIS — H8110 Benign paroxysmal vertigo, unspecified ear: Secondary | ICD-10-CM | POA: Diagnosis not present

## 2019-08-11 DIAGNOSIS — G309 Alzheimer's disease, unspecified: Secondary | ICD-10-CM | POA: Diagnosis not present

## 2019-08-11 DIAGNOSIS — R279 Unspecified lack of coordination: Secondary | ICD-10-CM | POA: Diagnosis not present

## 2019-08-13 DIAGNOSIS — R4701 Aphasia: Secondary | ICD-10-CM | POA: Diagnosis not present

## 2019-08-13 DIAGNOSIS — M6281 Muscle weakness (generalized): Secondary | ICD-10-CM | POA: Diagnosis not present

## 2019-08-13 DIAGNOSIS — E538 Deficiency of other specified B group vitamins: Secondary | ICD-10-CM | POA: Diagnosis not present

## 2019-08-13 DIAGNOSIS — R41841 Cognitive communication deficit: Secondary | ICD-10-CM | POA: Diagnosis not present

## 2019-08-13 DIAGNOSIS — F0280 Dementia in other diseases classified elsewhere without behavioral disturbance: Secondary | ICD-10-CM | POA: Diagnosis not present

## 2019-08-13 DIAGNOSIS — R279 Unspecified lack of coordination: Secondary | ICD-10-CM | POA: Diagnosis not present

## 2019-08-19 DIAGNOSIS — F0280 Dementia in other diseases classified elsewhere without behavioral disturbance: Secondary | ICD-10-CM | POA: Diagnosis not present

## 2019-08-19 DIAGNOSIS — R41841 Cognitive communication deficit: Secondary | ICD-10-CM | POA: Diagnosis not present

## 2019-08-19 DIAGNOSIS — R279 Unspecified lack of coordination: Secondary | ICD-10-CM | POA: Diagnosis not present

## 2019-08-19 DIAGNOSIS — E538 Deficiency of other specified B group vitamins: Secondary | ICD-10-CM | POA: Diagnosis not present

## 2019-08-19 DIAGNOSIS — M6281 Muscle weakness (generalized): Secondary | ICD-10-CM | POA: Diagnosis not present

## 2019-08-19 DIAGNOSIS — R4701 Aphasia: Secondary | ICD-10-CM | POA: Diagnosis not present

## 2019-08-29 DIAGNOSIS — F0390 Unspecified dementia without behavioral disturbance: Secondary | ICD-10-CM | POA: Diagnosis not present

## 2019-08-29 DIAGNOSIS — Z593 Problems related to living in residential institution: Secondary | ICD-10-CM | POA: Diagnosis not present

## 2019-08-29 DIAGNOSIS — E538 Deficiency of other specified B group vitamins: Secondary | ICD-10-CM | POA: Diagnosis not present

## 2019-08-29 DIAGNOSIS — K5900 Constipation, unspecified: Secondary | ICD-10-CM | POA: Diagnosis not present

## 2019-09-10 DIAGNOSIS — R413 Other amnesia: Secondary | ICD-10-CM | POA: Diagnosis not present

## 2019-09-18 ENCOUNTER — Other Ambulatory Visit
Admission: RE | Admit: 2019-09-18 | Discharge: 2019-09-18 | Disposition: A | Discharge status: Home / Self Care | Payer: Medicare Other | Source: Ambulatory Visit | Attending: Internal Medicine | Admitting: Internal Medicine

## 2019-09-18 DIAGNOSIS — N183 Chronic kidney disease, stage 3 unspecified: Secondary | ICD-10-CM | POA: Diagnosis not present

## 2019-09-18 LAB — BASIC METABOLIC PANEL
Anion gap: 7 (ref 5–15)
BUN: 20 mg/dL (ref 8–23)
CO2: 25 mmol/L (ref 22–32)
Calcium: 9 mg/dL (ref 8.9–10.3)
Chloride: 106 mmol/L (ref 98–111)
Creatinine, Ser: 0.71 mg/dL (ref 0.44–1.00)
GFR calc Af Amer: >60 mL/min (ref >60)
GFR calc non Af Amer: >60 mL/min (ref >60)
Glucose, Bld: 94 mg/dL (ref 70–99)
Potassium: 4.5 mmol/L (ref 3.5–5.1)
Sodium: 138 mmol/L (ref 135–145)

## 2019-09-23 DIAGNOSIS — R3129 Other microscopic hematuria: Secondary | ICD-10-CM | POA: Diagnosis not present

## 2019-09-23 DIAGNOSIS — N182 Chronic kidney disease, stage 2 (mild): Secondary | ICD-10-CM | POA: Diagnosis not present

## 2019-10-27 ENCOUNTER — Encounter
Admission: RE | Admit: 2019-10-27 | Discharge: 2019-10-27 | Disposition: A | Discharge status: Home / Self Care | Payer: Medicare Other | Source: Ambulatory Visit | Attending: Internal Medicine | Admitting: Internal Medicine

## 2019-11-17 DIAGNOSIS — Z20828 Contact with and (suspected) exposure to other viral communicable diseases: Secondary | ICD-10-CM | POA: Diagnosis not present

## 2019-11-24 DIAGNOSIS — Z20828 Contact with and (suspected) exposure to other viral communicable diseases: Secondary | ICD-10-CM | POA: Diagnosis not present

## 2019-12-29 DIAGNOSIS — F0390 Unspecified dementia without behavioral disturbance: Secondary | ICD-10-CM | POA: Diagnosis not present

## 2019-12-29 DIAGNOSIS — E538 Deficiency of other specified B group vitamins: Secondary | ICD-10-CM | POA: Diagnosis not present

## 2019-12-29 DIAGNOSIS — Z593 Problems related to living in residential institution: Secondary | ICD-10-CM | POA: Diagnosis not present

## 2019-12-29 DIAGNOSIS — K219 Gastro-esophageal reflux disease without esophagitis: Secondary | ICD-10-CM | POA: Diagnosis not present

## 2019-12-31 DIAGNOSIS — Z20828 Contact with and (suspected) exposure to other viral communicable diseases: Secondary | ICD-10-CM | POA: Diagnosis not present

## 2020-01-05 DIAGNOSIS — Z20828 Contact with and (suspected) exposure to other viral communicable diseases: Secondary | ICD-10-CM | POA: Diagnosis not present

## 2020-01-19 DIAGNOSIS — Z23 Encounter for immunization: Secondary | ICD-10-CM | POA: Diagnosis not present

## 2020-02-19 ENCOUNTER — Encounter
Admission: RE | Admit: 2020-02-19 | Discharge: 2020-02-19 | Disposition: A | Discharge status: Home / Self Care | Payer: Medicare Other | Source: Ambulatory Visit | Attending: Internal Medicine | Admitting: Internal Medicine

## 2020-02-19 DIAGNOSIS — Z23 Encounter for immunization: Secondary | ICD-10-CM | POA: Diagnosis not present

## 2020-04-26 ENCOUNTER — Ambulatory Visit: Payer: Medicare Other

## 2020-04-30 ENCOUNTER — Ambulatory Visit: Payer: Self-pay | Admitting: Urology

## 2020-05-05 ENCOUNTER — Other Ambulatory Visit: Payer: Self-pay | Admitting: Internal Medicine

## 2020-05-05 ENCOUNTER — Ambulatory Visit: Payer: BC Managed Care – PPO | Admitting: Physician Assistant

## 2020-05-05 ENCOUNTER — Other Ambulatory Visit: Payer: Self-pay

## 2020-05-05 ENCOUNTER — Encounter: Payer: Self-pay | Admitting: Physician Assistant

## 2020-05-05 ENCOUNTER — Ambulatory Visit (INDEPENDENT_AMBULATORY_CARE_PROVIDER_SITE_OTHER): Payer: Medicare Other | Admitting: Physician Assistant

## 2020-05-05 VITALS — BP 128/83 | HR 102 | Ht 60.0 in | Wt 135.0 lb

## 2020-05-05 DIAGNOSIS — R3121 Asymptomatic microscopic hematuria: Secondary | ICD-10-CM

## 2020-05-05 NOTE — Progress Notes (Signed)
In and Out Catheterization  Patient is present today for a I & O catheterization due to [place]. Patient was cleaned and prepped in a sterile fashion with betadine . A 14FR cath was inserted no complications were noted , 35ml of urine return was noted, urine was yellow in color. A clean urine sample was collected for UA. Bladder was drained and catheter was removed without difficulty.    Preformed by: Debroah Loop, PA-C

## 2020-05-05 NOTE — Progress Notes (Signed)
05/05/2020 10:23 AM   Carol Stark Sep 20, 1931 [id number]  CC: Chief Complaint  Patient presents with  . Hematuria    HPI: Carol Stark is a 87 y.o. female with PMH microscopic hematuria with benign work-up in late 2020 who presents today for annual follow-up.  Today she reports no dysuria, flank pain, or gross hematuria in the past year. No acute concerns today.  She underwent renal ultrasound with a mobile ultrasound unit on 04/29/2020 with findings of no renal masses, hydronephrosis, or calculi.  Original imaging unavailable for review.  In-office catheterized UA today positive for trace lysed blood; urine microscopy with many bacteria.  PMH: Past Medical History:  Diagnosis Date  . Allergic rhinitis   . Arthritis   . GERD (gastroesophageal reflux disease)   . History of chicken pox   . UTI (lower urinary tract infection)     Surgical History: Past Surgical History:  Procedure Laterality Date  . CATARACT EXTRACTION W/PHACO Right 06/21/2017   Procedure: CATARACT EXTRACTION PHACO AND INTRAOCULAR LENS PLACEMENT (IOC);  Surgeon: Leandrew Koyanagi, MD;  Location: [facility] ORS;  Service: Ophthalmology;  Laterality: Right;  [place] 00:55.2 AP% 14.4 CDE 7.93 Fluid Pack Lot # [id number]  . CATARACT EXTRACTION W/PHACO Left 09/25/2017   Procedure: CATARACT EXTRACTION PHACO AND INTRAOCULAR LENS PLACEMENT (IOC);  Surgeon: Leandrew Koyanagi, MD;  Location: [facility] ORS;  Service: Ophthalmology;  Laterality: Left;  [place] 00:46 AP% 14.2 CDE 6.65 Fluid pack lot # [id number]  . COLONOSCOPY    . TONSILLECTOMY      Home Medications:  Allergies as of 05/05/2020      Reactions   Biaxin [clarithromycin] Other (See Comments)   "Was not good"      Medication List       Accurate as of May 05, 2020 10:23 AM. If you have any questions, ask your nurse or doctor.        Calcium 1000 + D 1000-800 MG-UNIT Tabs Generic drug: Calcium Carb-Cholecalciferol Take by mouth.    esomeprazole 40 MG capsule Commonly known as: NEXIUM Take 1 capsule (40 mg total) by mouth daily before breakfast.   estradiol 0.1 MG/GM vaginal cream Commonly known as: ESTRACE   fluticasone 50 MCG/ACT nasal spray Commonly known as: FLONASE Place into the nose.   Vitamin C 500 MG Caps Take by mouth.       Allergies:  Allergies  Allergen Reactions  . Biaxin [Clarithromycin] Other (See Comments)    "Was not good"    Family History: Family History  Problem Relation Age of Onset  . Arthritis Mother   . Heart disease Father     Social History:   reports that she has never smoked. She has never used smokeless tobacco. She reports current alcohol use. She reports that she does not use drugs.  Physical Exam: BP 128/83   Pulse (!) 102   Ht 5' (1.524 m)   Wt 135 lb (61.2 kg)   LMP  (LMP Unknown)   BMI 26.37 kg/m   Constitutional:  Alert and oriented, no acute distress, nontoxic appearing HEENT: [place], AT Cardiovascular: No clubbing, cyanosis, or edema Respiratory: Normal respiratory effort, no increased work of breathing GU: No urethral caruncle Skin: No rashes, bruises or suspicious lesions Neurologic: Grossly intact, no focal deficits, moving all 4 extremities Psychiatric: Normal mood and affect  Laboratory Data: Results for orders placed or performed in visit on 05/05/20  Microscopic Examination   Urine  Result Value Ref Range   WBC, UA 0-5  0 - 5 /hpf   RBC 0-2 0 - 2 /hpf   Epithelial Cells (non renal) 0-10 0 - 10 /hpf   Bacteria, UA Many (A) None seen/Few  Urinalysis, Complete  Result Value Ref Range   Specific Gravity, UA <1.005 (L) 1.005 - 1.030   pH, UA 5.5 5.0 - 7.5   Color, UA Yellow Yellow   Appearance Ur Clear Clear   Leukocytes,UA Negative Negative   Protein,UA Negative Negative/Trace   Glucose, UA Negative Negative   Ketones, UA Negative Negative   RBC, UA Trace (A) Negative   Bilirubin, UA Negative Negative   Urobilinogen, Ur 0.2 0.2 - 1.0  mg/dL   Nitrite, UA Negative Negative   Microscopic Examination See below:    Assessment & Plan:   1. Asymptomatic microscopic hematuria Resolved on cath UA today, and patient remains asymptomatic.  Recent renal ultrasound benign, however I am unable to review the original images to confirm.  Regardless, given symptom stability and resolved microscopic hematuria today, no indication for further work-up at this time.  Recommend annual follow-up with UA.  Ultimately she may require repeat hematuria work-up in 2 to 4 years if she has recurrence.  Discussed with the patient that she should return to clinic sooner if she develops dysuria, flank pain, or gross hematuria.  Will repeat work-up at that time.  Notably, UA today with bacteriuria.  Will send for culture for further evaluation but defer therapy unless she becomes symptomatic of UTI. - Urinalysis, Complete - CULTURE, URINE COMPREHENSIVE   Return in about 1 year (around 05/05/2021) for Annual [place] follow-up with cath UA.  Debroah Loop, PA-C  [facility] [address], [place], [place] [address] [phone]

## 2020-05-06 LAB — URINALYSIS, COMPLETE
Bilirubin, UA: NEGATIVE
Glucose, UA: NEGATIVE
Ketones, UA: NEGATIVE
Leukocytes,UA: NEGATIVE
Nitrite, UA: NEGATIVE
Protein,UA: NEGATIVE
Specific Gravity, UA: <1.005 — ABNORMAL LOW (ref 1.005–1.030)
Urobilinogen, Ur: 0.2 mg/dL (ref 0.2–1.0)
pH, UA: 5.5 (ref 5.0–7.5)

## 2020-05-06 LAB — MICROSCOPIC EXAMINATION

## 2020-05-09 LAB — CULTURE, URINE COMPREHENSIVE

## 2020-05-10 ENCOUNTER — Telehealth: Payer: Self-pay

## 2020-05-10 NOTE — Telephone Encounter (Signed)
Bobbie Cantrell (patients caregiver) does not think patient is having any symptoms, reports she will call back if she does.

## 2020-05-10 NOTE — Telephone Encounter (Signed)
-----   Message from Debroah Loop, [place] sent at 05/10/2020  8:47 AM EST ----- Please reach out to her to see if she's developed any infective symptoms. If not, culture likely represents colonization and no indication to treat. ----- Message ----- From: Interface, [facility] Lab Results In Sent: 05/06/2020   4:37 PM EST To: Debroah Loop, PA-C

## 2020-05-18 ENCOUNTER — Other Ambulatory Visit
Admission: RE | Admit: 2020-05-18 | Discharge: 2020-05-18 | Disposition: A | Discharge status: Home / Self Care | Payer: Medicare Other | Source: Ambulatory Visit | Attending: Internal Medicine | Admitting: Internal Medicine

## 2020-05-18 DIAGNOSIS — Z79899 Other long term (current) drug therapy: Secondary | ICD-10-CM | POA: Diagnosis not present

## 2020-05-18 DIAGNOSIS — F0390 Unspecified dementia without behavioral disturbance: Secondary | ICD-10-CM | POA: Insufficient documentation

## 2020-05-18 DIAGNOSIS — E538 Deficiency of other specified B group vitamins: Secondary | ICD-10-CM | POA: Insufficient documentation

## 2020-05-18 DIAGNOSIS — R410 Disorientation, unspecified: Secondary | ICD-10-CM | POA: Insufficient documentation

## 2020-05-18 DIAGNOSIS — Z593 Problems related to living in residential institution: Secondary | ICD-10-CM | POA: Insufficient documentation

## 2020-05-18 LAB — URINALYSIS, ROUTINE W REFLEX MICROSCOPIC
Bilirubin Urine: NEGATIVE
Glucose, UA: NEGATIVE mg/dL
Ketones, ur: NEGATIVE mg/dL
Nitrite: NEGATIVE
Protein, ur: NEGATIVE mg/dL
Specific Gravity, Urine: 1.028 (ref 1.005–1.030)
pH: 5 (ref 5.0–8.0)

## 2020-05-19 ENCOUNTER — Other Ambulatory Visit
Admission: RE | Admit: 2020-05-19 | Discharge: 2020-05-19 | Disposition: A | Discharge status: Home / Self Care | Payer: Medicare Other | Source: Ambulatory Visit | Attending: Internal Medicine | Admitting: Internal Medicine

## 2020-05-19 DIAGNOSIS — F0390 Unspecified dementia without behavioral disturbance: Secondary | ICD-10-CM | POA: Diagnosis present

## 2020-05-19 LAB — CBC WITH DIFFERENTIAL/PLATELET
Abs Immature Granulocytes: 0.01 10*3/uL (ref 0.00–0.07)
Basophils Absolute: 0.1 10*3/uL (ref 0.0–0.1)
Basophils Relative: 1 %
Eosinophils Absolute: 0.1 10*3/uL (ref 0.0–0.5)
Eosinophils Relative: 2 %
HCT: 38.2 % (ref 36.0–46.0)
Hemoglobin: 12.6 g/dL (ref 12.0–15.0)
Immature Granulocytes: 0 %
Lymphocytes Relative: 35 %
Lymphs Abs: 2.1 10*3/uL (ref 0.7–4.0)
MCH: 29.3 pg (ref 26.0–34.0)
MCHC: 33 g/dL (ref 30.0–36.0)
MCV: 88.8 fL (ref 80.0–100.0)
Monocytes Absolute: 0.5 10*3/uL (ref 0.1–1.0)
Monocytes Relative: 9 %
Neutro Abs: 3.1 10*3/uL (ref 1.7–7.7)
Neutrophils Relative %: 53 %
Platelets: 216 10*3/uL (ref 150–400)
RBC: 4.3 MIL/uL (ref 3.87–5.11)
RDW: 13 % (ref 11.5–15.5)
WBC: 5.9 10*3/uL (ref 4.0–10.5)
nRBC: 0 % (ref 0.0–0.2)

## 2020-05-19 LAB — COMPREHENSIVE METABOLIC PANEL
ALT: 11 U/L (ref 0–44)
AST: 13 U/L — ABNORMAL LOW (ref 15–41)
Albumin: 3.5 g/dL (ref 3.5–5.0)
Alkaline Phosphatase: 80 U/L (ref 38–126)
Anion gap: 9 (ref 5–15)
BUN: 19 mg/dL (ref 8–23)
CO2: 24 mmol/L (ref 22–32)
Calcium: 8.9 mg/dL (ref 8.9–10.3)
Chloride: 102 mmol/L (ref 98–111)
Creatinine, Ser: 0.75 mg/dL (ref 0.44–1.00)
GFR, Estimated: >60 mL/min (ref >60)
Glucose, Bld: 90 mg/dL (ref 70–99)
Potassium: 4.2 mmol/L (ref 3.5–5.1)
Sodium: 135 mmol/L (ref 135–145)
Total Bilirubin: 0.5 mg/dL (ref 0.3–1.2)
Total Protein: 6.3 g/dL — ABNORMAL LOW (ref 6.5–8.1)

## 2020-05-20 LAB — URINE CULTURE: Culture: >=100000 — AB

## 2020-06-22 ENCOUNTER — Other Ambulatory Visit
Admission: RE | Admit: 2020-06-22 | Discharge: 2020-06-22 | Disposition: A | Discharge status: Home / Self Care | Payer: Medicare Other | Source: Ambulatory Visit | Attending: Internal Medicine | Admitting: Internal Medicine

## 2020-06-22 DIAGNOSIS — E538 Deficiency of other specified B group vitamins: Secondary | ICD-10-CM | POA: Insufficient documentation

## 2020-06-22 LAB — VITAMIN B12: Vitamin B-12: 962 pg/mL — ABNORMAL HIGH (ref 180–914)

## 2020-07-06 ENCOUNTER — Other Ambulatory Visit
Admission: RE | Admit: 2020-07-06 | Discharge: 2020-07-06 | Disposition: A | Discharge status: Home / Self Care | Payer: Medicare Other | Source: Ambulatory Visit | Attending: Internal Medicine | Admitting: Internal Medicine

## 2020-07-06 DIAGNOSIS — F0391 Unspecified dementia with behavioral disturbance: Secondary | ICD-10-CM | POA: Insufficient documentation

## 2020-07-06 LAB — URINALYSIS, COMPLETE (UACMP) WITH MICROSCOPIC
Bilirubin Urine: NEGATIVE
Glucose, UA: NEGATIVE mg/dL
Ketones, ur: NEGATIVE mg/dL
Nitrite: POSITIVE — AB
Protein, ur: NEGATIVE mg/dL
RBC / HPF: >50 RBC/hpf — ABNORMAL HIGH (ref 0–5)
Specific Gravity, Urine: 1.017 (ref 1.005–1.030)
WBC, UA: >50 WBC/hpf — ABNORMAL HIGH (ref 0–5)
pH: 5 (ref 5.0–8.0)

## 2020-07-08 LAB — URINE CULTURE: Culture: >=100000 — AB

## 2020-08-02 ENCOUNTER — Other Ambulatory Visit
Admission: RE | Admit: 2020-08-02 | Discharge: 2020-08-02 | Disposition: A | Discharge status: Home / Self Care | Payer: Medicare Other | Source: Ambulatory Visit | Attending: Internal Medicine | Admitting: Internal Medicine

## 2020-08-02 DIAGNOSIS — R109 Unspecified abdominal pain: Secondary | ICD-10-CM | POA: Insufficient documentation

## 2020-08-02 LAB — URINALYSIS, ROUTINE W REFLEX MICROSCOPIC
Bilirubin Urine: NEGATIVE
Glucose, UA: NEGATIVE mg/dL
Ketones, ur: NEGATIVE mg/dL
Nitrite: NEGATIVE
Protein, ur: NEGATIVE mg/dL
Specific Gravity, Urine: 1.011 (ref 1.005–1.030)
pH: 6 (ref 5.0–8.0)

## 2020-08-02 LAB — COMPREHENSIVE METABOLIC PANEL
ALT: 16 U/L (ref 0–44)
AST: 19 U/L (ref 15–41)
Albumin: 4.2 g/dL (ref 3.5–5.0)
Alkaline Phosphatase: 97 U/L (ref 38–126)
Anion gap: 9 (ref 5–15)
BUN: 17 mg/dL (ref 8–23)
CO2: 25 mmol/L (ref 22–32)
Calcium: 9.1 mg/dL (ref 8.9–10.3)
Chloride: 102 mmol/L (ref 98–111)
Creatinine, Ser: 0.73 mg/dL (ref 0.44–1.00)
GFR, Estimated: >60 mL/min (ref >60)
Glucose, Bld: 124 mg/dL — ABNORMAL HIGH (ref 70–99)
Potassium: 4.1 mmol/L (ref 3.5–5.1)
Sodium: 136 mmol/L (ref 135–145)
Total Bilirubin: 0.7 mg/dL (ref 0.3–1.2)
Total Protein: 7.5 g/dL (ref 6.5–8.1)

## 2020-08-02 LAB — CBC
HCT: 41 % (ref 36.0–46.0)
Hemoglobin: 13.6 g/dL (ref 12.0–15.0)
MCH: 29.8 pg (ref 26.0–34.0)
MCHC: 33.2 g/dL (ref 30.0–36.0)
MCV: 89.7 fL (ref 80.0–100.0)
Platelets: 241 10*3/uL (ref 150–400)
RBC: 4.57 MIL/uL (ref 3.87–5.11)
RDW: 12.3 % (ref 11.5–15.5)
WBC: 7.1 10*3/uL (ref 4.0–10.5)
nRBC: 0 % (ref 0.0–0.2)

## 2020-08-04 LAB — URINE CULTURE

## 2020-09-08 ENCOUNTER — Other Ambulatory Visit
Admission: RE | Admit: 2020-09-08 | Discharge: 2020-09-08 | Disposition: A | Discharge status: Home / Self Care | Payer: Medicare Other | Source: Skilled Nursing Facility | Attending: Internal Medicine | Admitting: Internal Medicine

## 2020-09-08 DIAGNOSIS — R300 Dysuria: Secondary | ICD-10-CM | POA: Insufficient documentation

## 2020-09-08 LAB — URINALYSIS, COMPLETE (UACMP) WITH MICROSCOPIC
Bilirubin Urine: NEGATIVE
Glucose, UA: NEGATIVE mg/dL
Ketones, ur: NEGATIVE mg/dL
Nitrite: NEGATIVE
Protein, ur: NEGATIVE mg/dL
Specific Gravity, Urine: 1.01 (ref 1.005–1.030)
pH: 7 (ref 5.0–8.0)

## 2020-09-10 LAB — URINE CULTURE

## 2020-11-01 IMAGING — CT CT ABD-PEL WO/W CM
2 of 10 series · 13 of 46 positions shown, 15 images · IV contrast (omnipaque)
Comparison: Abdomen radiograph 12/31/2015

CLINICAL DATA: Microscopic hematuria

EXAM:
CT ABDOMEN AND PELVIS WITHOUT AND WITH CONTRAST
TECHNIQUE: Multidetector CT imaging of the abdomen and pelvis was performed
following the standard protocol before and following the bolus
administration of intravenous contrast.
CONTRAST:  100mL OMNIPAQUE IOHEXOL 300 MG/ML  SOLN

[Series 5: cor without without pre 2.00 cor · coronal · non-contrast · 0.69mm/px · 3 of 122 slices shown]
[im 31/122  soft-tissue]
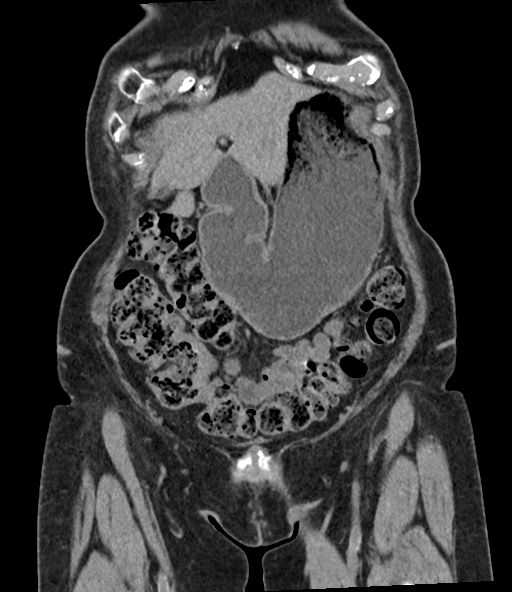
[im 61/122  soft-tissue]
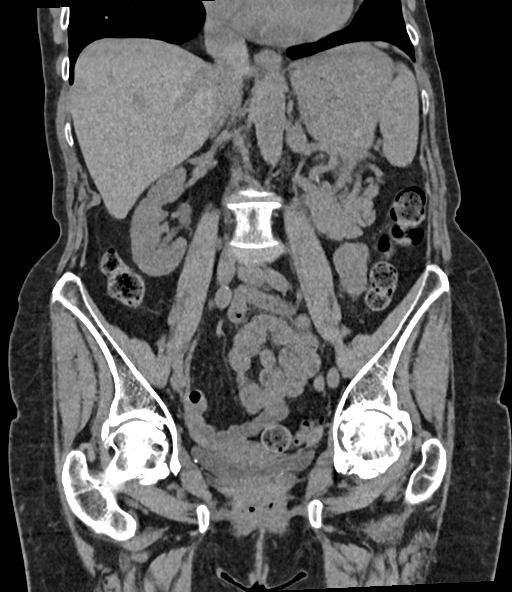
[im 91/122  soft-tissue]
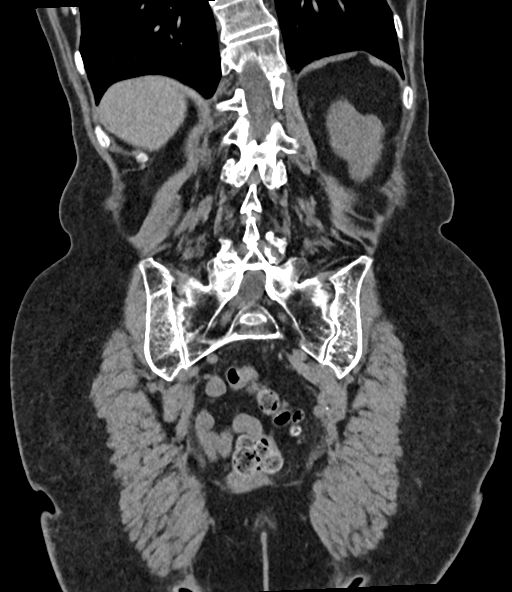

[Series 17: axial delay delay prone 5.00 · axial · delayed · 0.67mm/px · z∈[-1518,-1178]mm · 10 of 84 slices shown, 12 images]
[im 8/84  soft-tissue]
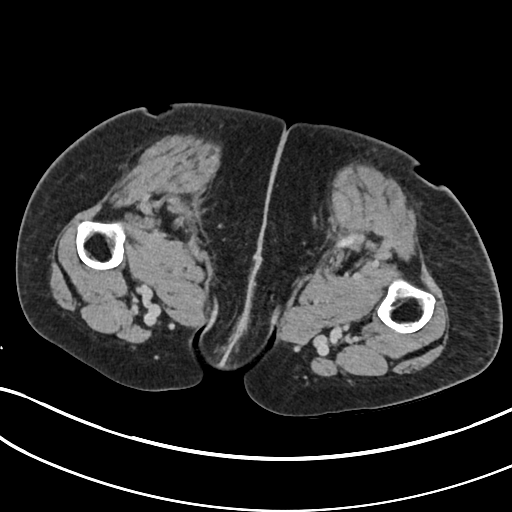
[im 8/84  bone]
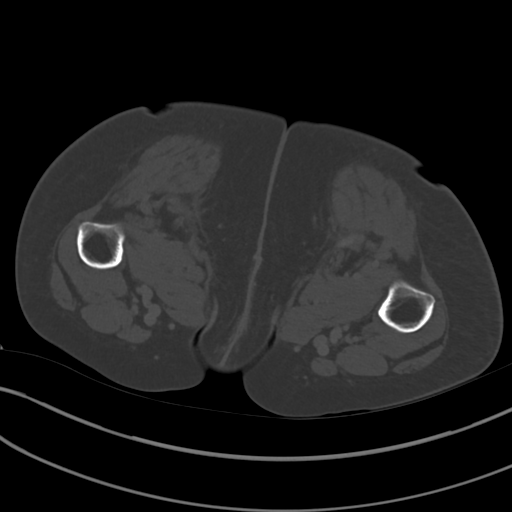
[im 16/84  soft-tissue]
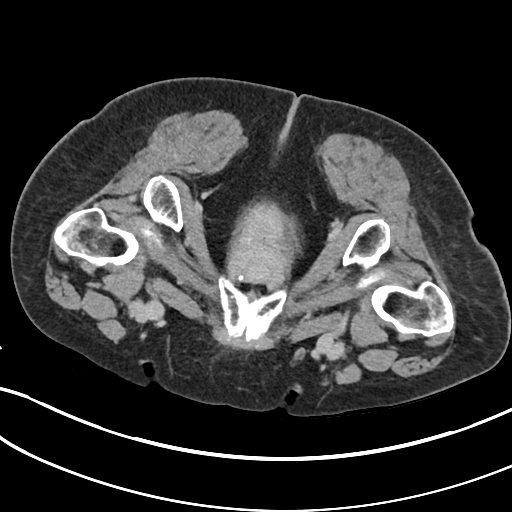
[im 23/84  soft-tissue]
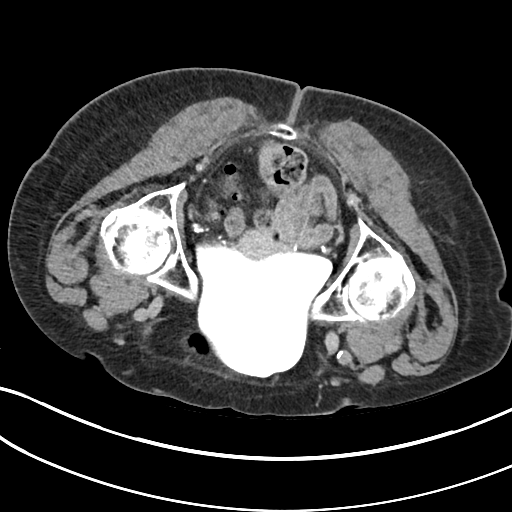
[im 31/84  soft-tissue]
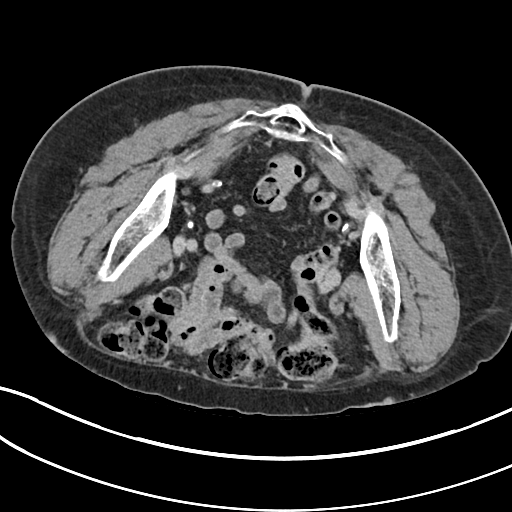
[im 38/84  soft-tissue]
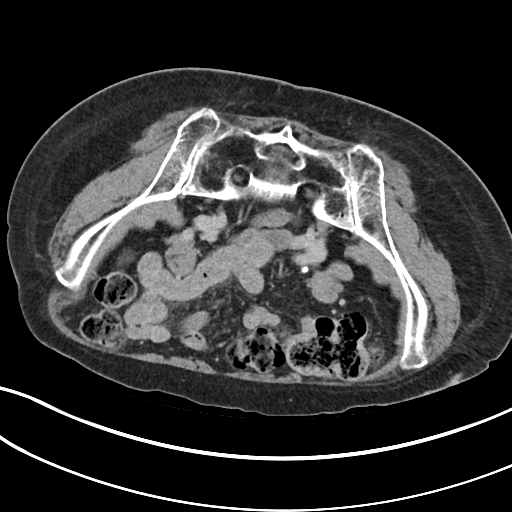
[im 46/84  soft-tissue]
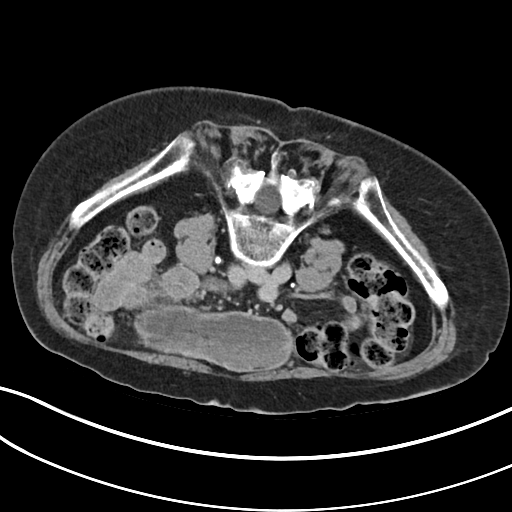
[im 53/84  soft-tissue]
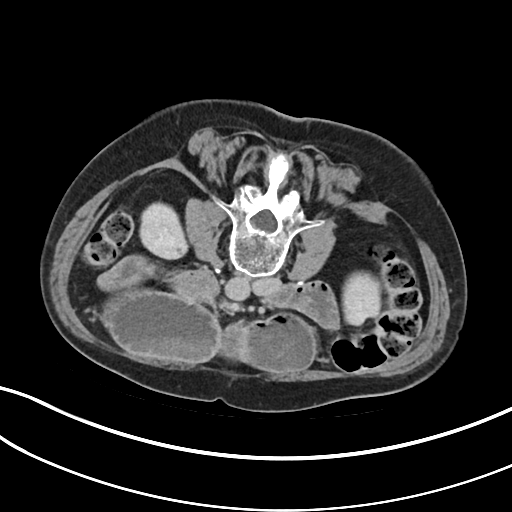
[im 61/84  soft-tissue]
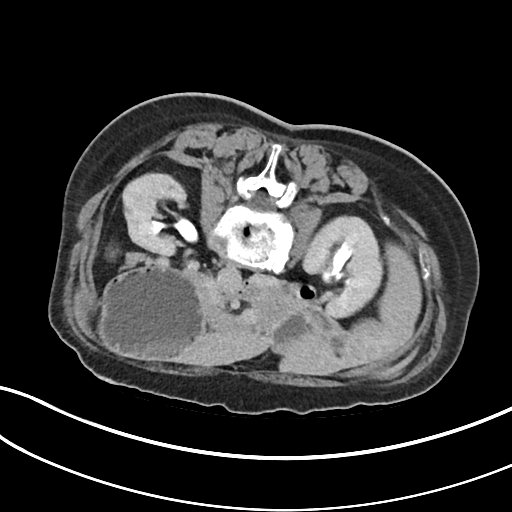
[im 68/84  soft-tissue]
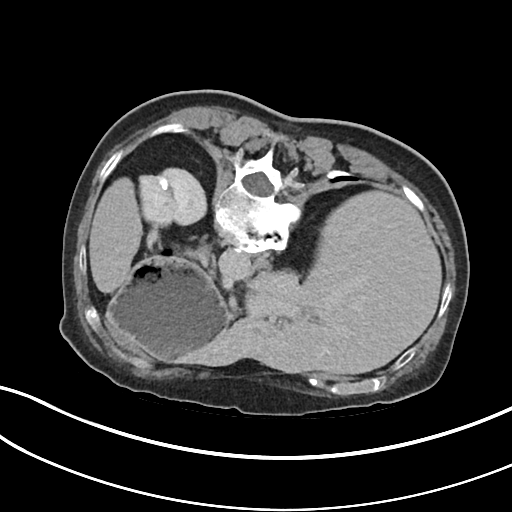
[im 68/84  bone]
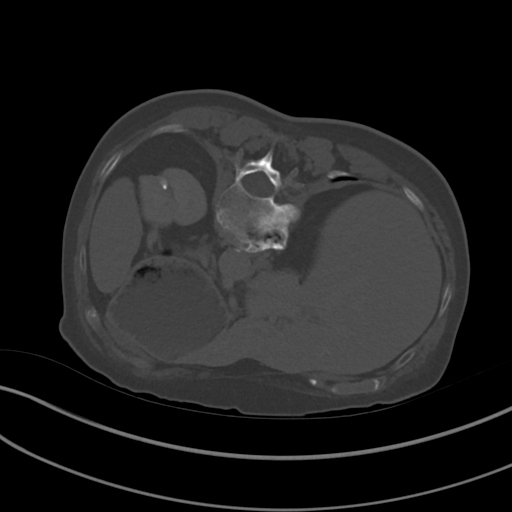
[im 76/84  soft-tissue]
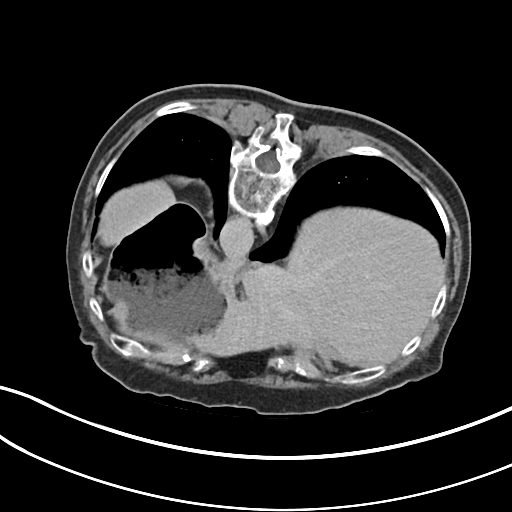

[13 of 46 positions shown; findings below may reference images not displayed]

FINDINGS: Lower chest: Descending thoracic aortic atherosclerotic
calcification.

Hepatobiliary: 0.9 by 0.5 cm hypodense lesion in the dome of the
right hepatic lobe anteriorly on image [DATE], probably a small cyst.

The gallbladder is nondistended but appears unremarkable. No biliary
dilatation.

Pancreas: Unremarkable

Spleen: Unremarkable

Adrenals/Urinary Tract: Small hypodense bilateral renal lesions are
technically too small to characterize although statistically likely
to be benign. There is scarring in the left kidney upper pole
posteriorly. There are 2 adjacent left kidney upper pole calculi and
what is likely a calyceal diverticulum, larger stone measuring
cm in diameter.

Trace amount of gas in the urinary bladder, query recent
catheterization.

No additional filling defects along the urothelium identified. No
abnormal enhancement along the urothelium noted.

Stomach/Bowel: Periampullary duodenal diverticulum. Prominent stool
throughout the colon favors constipation. Appendix normal. Sigmoid
colon diverticulosis.

Vascular/Lymphatic: Aortoiliac atherosclerotic vascular disease.

Reproductive: Small size but otherwise unremarkable uterus. Adnexa
unremarkable.

Other: No supplemental non-categorized findings.

Musculoskeletal: Degenerative arthropathy of both hips with
considerable spurring. Levoconvex lumbar scoliosis. Grade 1
degenerative anterolisthesis at L4-5.
IMPRESSION: 1. There are 2 adjacent left kidney upper pole calculi and what is
likely a calyceal diverticulum, larger stone measuring 0.4 cm in
diameter. There is adjacent scarring in the left kidney upper pole
posteriorly.
2. Other imaging findings of potential clinical significance:
Prominent stool throughout the colon favors constipation. Sigmoid
colon diverticulosis. Levoconvex lumbar scoliosis with spondylosis
and degenerative disc disease. Degenerative arthropathy of both
hips. Small hypodense bilateral renal lesions are technically too
small to characterize although statistically likely to be benign.
Periampullary duodenal diverticulum.

Aortic Atherosclerosis ([id number]).

## 2021-05-09 ENCOUNTER — Ambulatory Visit: Payer: Self-pay | Admitting: Physician Assistant

## 2021-05-10 NOTE — Progress Notes (Signed)
05/11/2021 3:15 PM   Carol Stark May 16, 1931 [id number]  Referring provider: Leone Haven, MD [address] [place],  [place] [address]  Chief Complaint  Patient presents with   Hematuria   Urological history: 1. High risk hematuria -non-smoker -CTU 2020 - left nephrolithiasis -cysto 2021 - NED -no reports of gross heme -CATH UA negative for micro heme  2. Nephrolithiasis -CTU 2020 - 2 adjacent left kidney upper pole calculi and what is likely a calyceal diverticulum, larger stone measuring 0.4 cm in diameter.  HPI: Carol Stark is a 88 y.o. female who presents today for a one year follow up.  She is by herself and a poor historian.  CATH UA negative for micro heme  She is not having any issues with her bladder today.  Patient denies any modifying or aggravating factors.  Patient denies any gross hematuria, dysuria or suprapubic/flank pain.  Patient denies any fevers, chills, nausea or vomiting.    PMH: Past Medical History:  Diagnosis Date   Allergic rhinitis    Arthritis    GERD (gastroesophageal reflux disease)    History of chicken pox    UTI (lower urinary tract infection)     Surgical History: Past Surgical History:  Procedure Laterality Date   CATARACT EXTRACTION W/PHACO Right 06/21/2017   Procedure: CATARACT EXTRACTION PHACO AND INTRAOCULAR LENS PLACEMENT ([place]);  Surgeon: Leandrew Koyanagi, MD;  Location: [facility];  Service: Ophthalmology;  Laterality: Right;  [place] 00:55.2 AP% 14.4 CDE 7.93 Fluid Pack Lot # [id number]   CATARACT EXTRACTION W/PHACO Left 09/25/2017   Procedure: CATARACT EXTRACTION PHACO AND INTRAOCULAR LENS PLACEMENT (IOC);  Surgeon: Leandrew Koyanagi, MD;  Location: [facility] OR[facility];  Service: Ophthalmology;  Laterality: Left;  [place] 00:46 AP% 14.2 CDE 6.65 Fluid pack lot # [id number]   COLONOSCOPY     TONSILLECTOMY      Home Medications:  Allergies as of 05/11/2021       Reactions   Biaxin [clarithromycin]  Other (See Comments)   "Was not good"        Medication List        Accurate as of May 11, 2021  3:15 PM. If you have any questions, ask your nurse or doctor.          Calcium 1000 + D 1000-20 MG-MCG Tabs Generic drug: Calcium Carb-Cholecalciferol Take by mouth.   donepezil 10 MG tablet Commonly known as: ARICEPT Take 10 mg by mouth daily.   esomeprazole 40 MG capsule Commonly known as: NEXIUM Take 1 capsule (40 mg total) by mouth daily before breakfast.   estradiol 0.1 MG/GM vaginal cream Commonly known as: ESTRACE   fluticasone 50 MCG/ACT nasal spray Commonly known as: FLONASE Place into the nose.   Vitamin C 500 MG Caps Take by mouth.        Allergies:  Allergies  Allergen Reactions   Biaxin [Clarithromycin] Other (See Comments)    "Was not good"    Family History: Family History  Problem Relation Age of Onset   Arthritis Mother    Heart disease Father     Social History:  reports that she has never smoked. She has never used smokeless tobacco. She reports current alcohol use. She reports that she does not use drugs.  ROS: Pertinent ROS in HPI  Physical Exam: BP (!) 151/77    Pulse 89    Ht 5\' 5"  (1.651 m)    Wt 117 lb (53.1 kg)    LMP  (  LMP Unknown)    BMI 19.47 kg/m   Constitutional:  Well nourished. Alert and oriented, No acute distress. HEENT: [place] AT, mask in place.  Trachea midline Cardiovascular: No clubbing, cyanosis, or edema. Respiratory: Normal respiratory effort, no increased work of breathing. GU: No CVA tenderness.  No bladder fullness or masses.  Atrophic external genitalia, normal pubic hair distribution, no lesions.  Normal urethral meatus, no lesions, no prolapse, no discharge.   No urethral masses, tenderness and/or tenderness. No bladder fullness, tenderness or masses. Pale vagina mucosa, fair estrogen effect, no discharge, no lesions, good pelvic support, no cystocele and no rectocele noted.  Anus and perineum are without  rashes or lesions.     Neurologic: Grossly intact, no focal deficits, moving all 4 extremities. Psychiatric: Normal mood and affect.    Laboratory Data: Lab Results  Component Value Date   WBC 7.1 08/02/2020   HGB 13.6 08/02/2020   HCT 41.0 08/02/2020   MCV 89.7 08/02/2020   PLT 241 08/02/2020    Lab Results  Component Value Date   CREATININE 0.73 08/02/2020    Lab Results  Component Value Date   AST 19 08/02/2020   Lab Results  Component Value Date   ALT 16 08/02/2020    Urinalysis Component     Latest Ref Rng & Units 05/11/2021  Specific Gravity, UA     1.005 - 1.030 >1.030 (H)  pH, UA     5.0 - 7.5 5.5  Color, UA     Yellow Yellow  Appearance Ur     Clear Cloudy (A)  Leukocytes,UA     Negative Trace (A)  Protein,UA     Negative/Trace Negative  Glucose, UA     Negative Negative  Ketones, UA     Negative Negative  RBC, UA     Negative Trace (A)  Bilirubin, UA     Negative Negative  Urobilinogen, Ur     0.2 - 1.0 mg/dL 0.2  Nitrite, UA     Negative Negative  Microscopic Examination      See below:   Component     Latest Ref Rng & Units 05/11/2021  WBC, UA     0 - 5 /hpf 11-30 (A)  RBC     0 - 2 /hpf 0-2  Epithelial Cells (non renal)     0 - 10 /hpf 0-10  Mucus, UA     Not Estab. Present (A)  Bacteria, UA     None seen/Few Many (A)  I have reviewed the labs.   Pertinent Imaging: N/A   In and Out Catheterization Patient was cleaned and prepped in a sterile fashion with betadine . A 14 FR cath was inserted no complications were noted , 14 ml of urine return was noted, urine was yellow clear in color. A clean urine sample was collected for UA. Bladder was drained  PVR 40 mL.  And catheter was removed with out difficulty.    Assessment & Plan:    1. High risk hematuria -work up 2020 - NED -no report of gross heme -UA negative for micro heme  2. Nephrolithiasis -No complaints of renal colic at this visit  Return in about 1 year (around  05/11/2022) for CATH UA with SAM .  These notes generated with voice recognition software. I apologize for typographical errors.  Zara Council, PA-C  [facility] [address]  [place], [place] [address] [phone]

## 2021-05-11 ENCOUNTER — Other Ambulatory Visit: Payer: Self-pay

## 2021-05-11 ENCOUNTER — Ambulatory Visit (INDEPENDENT_AMBULATORY_CARE_PROVIDER_SITE_OTHER): Payer: Medicare Other | Admitting: Urology

## 2021-05-11 ENCOUNTER — Encounter: Payer: Self-pay | Admitting: Urology

## 2021-05-11 VITALS — BP 151/77 | HR 89 | Ht 65.0 in | Wt 117.0 lb

## 2021-05-11 DIAGNOSIS — N200 Calculus of kidney: Secondary | ICD-10-CM

## 2021-05-11 DIAGNOSIS — R319 Hematuria, unspecified: Secondary | ICD-10-CM

## 2021-05-12 LAB — URINALYSIS, COMPLETE
Bilirubin, UA: NEGATIVE
Glucose, UA: NEGATIVE
Ketones, UA: NEGATIVE
Nitrite, UA: NEGATIVE
Protein,UA: NEGATIVE
Specific Gravity, UA: >1.03 — ABNORMAL HIGH (ref 1.005–1.030)
Urobilinogen, Ur: 0.2 mg/dL (ref 0.2–1.0)
pH, UA: 5.5 (ref 5.0–7.5)

## 2021-05-12 LAB — MICROSCOPIC EXAMINATION

## 2022-05-11 ENCOUNTER — Ambulatory Visit: Payer: BC Managed Care – PPO | Admitting: Physician Assistant

## 2022-08-16 ENCOUNTER — Other Ambulatory Visit: Payer: Self-pay

## 2022-08-16 ENCOUNTER — Emergency Department: Payer: Medicare Other

## 2022-08-16 ENCOUNTER — Emergency Department
Admission: EM | Admit: 2022-08-16 | Discharge: 2022-08-16 | Disposition: A | Discharge status: Skilled Nursing Facility | Payer: Medicare Other | Attending: Emergency Medicine | Admitting: Emergency Medicine

## 2022-08-16 DIAGNOSIS — M25551 Pain in right hip: Secondary | ICD-10-CM | POA: Diagnosis not present

## 2022-08-16 DIAGNOSIS — F0390 Unspecified dementia without behavioral disturbance: Secondary | ICD-10-CM | POA: Insufficient documentation

## 2022-08-16 DIAGNOSIS — W19XXXA Unspecified fall, initial encounter: Secondary | ICD-10-CM | POA: Diagnosis not present

## 2022-08-16 DIAGNOSIS — Y92129 Unspecified place in nursing home as the place of occurrence of the external cause: Secondary | ICD-10-CM | POA: Insufficient documentation

## 2022-08-16 LAB — CBC
HCT: 35.9 % — ABNORMAL LOW (ref 36.0–46.0)
Hemoglobin: 11.4 g/dL — ABNORMAL LOW (ref 12.0–15.0)
MCH: 30.1 pg (ref 26.0–34.0)
MCHC: 31.8 g/dL (ref 30.0–36.0)
MCV: 94.7 fL (ref 80.0–100.0)
Platelets: 272 10*3/uL (ref 150–400)
RBC: 3.79 MIL/uL — ABNORMAL LOW (ref 3.87–5.11)
RDW: 12.1 % (ref 11.5–15.5)
WBC: 7 10*3/uL (ref 4.0–10.5)
nRBC: 0 % (ref 0.0–0.2)

## 2022-08-16 LAB — BASIC METABOLIC PANEL
Anion gap: 7 (ref 5–15)
BUN: 21 mg/dL (ref 8–23)
CO2: 25 mmol/L (ref 22–32)
Calcium: 8.7 mg/dL — ABNORMAL LOW (ref 8.9–10.3)
Chloride: 104 mmol/L (ref 98–111)
Creatinine, Ser: 0.75 mg/dL (ref 0.44–1.00)
GFR, Estimated: >60 mL/min (ref >60)
Glucose, Bld: 113 mg/dL — ABNORMAL HIGH (ref 70–99)
Potassium: 3.8 mmol/L (ref 3.5–5.1)
Sodium: 136 mmol/L (ref 135–145)

## 2022-08-16 NOTE — ED Notes (Signed)
Received phone call from patient's home RN stating pain is in right hip- not left.

## 2022-08-16 NOTE — Discharge Instructions (Signed)
Patient's labs and xrays were reassuring

## 2022-08-16 NOTE — ED Notes (Addendum)
First Nurse Note: Patient to ED via [facility] from [facility] for fall. Multiple falls over the last 24 hours. Concern for left hip. More Altered than normal but given ativan at 6am by facility. Hx of dementia, Parkinson.

## 2022-08-16 NOTE — ED Notes (Signed)
See first nurse note, pt not able to tell this RN pertinent information in triage at this time due to HX of dementia.

## 2022-08-16 NOTE — ED Provider Notes (Signed)
[facility] Provider Note    Event Date/Time   First MD Initiated Contact with Patient 08/16/22 1326     (approximate)   History   Fall   HPI  Carol Stark is a 89 y.o. female with a history of dementia sent in for evaluation after a fall.  Patient may have had 1 or 2 falls at facility, apparently complained of right hip pain.  Not on blood thinners.  Patient denies complaints at this time     Physical Exam   Triage Vital Signs: ED Triage Vitals [08/16/22 1141]  Enc Vitals Group     BP 113/63     Pulse Rate 88     Resp 18     Temp 98 F (36.7 C)     Temp Source Oral     SpO2 99 %     Weight      Height      Head Circumference      Peak Flow      Pain Score      Pain Loc      Pain Edu?      Excl. in GC?     Most recent vital signs: Vitals:   08/16/22 1141 08/16/22 1402  BP: 113/63 119/85  Pulse: 88 89  Resp: 18 16  Temp: 98 F (36.7 C)   SpO2: 99% 100%     General: Awake, no distress.  CV:  Good peripheral perfusion.  Resp:  Normal effort.  Abd:  No distention.  Other:  Good range of motion of all extremities, no pain with axial load on both hips.  No protuberance to palpation.  No evidence of head injury   ED Results / Procedures / Treatments   Labs (all labs ordered are listed, but only abnormal results are displayed) Labs Reviewed  CBC - Abnormal; Notable for the following components:      Result Value   RBC 3.79 (*)    Hemoglobin 11.4 (*)    HCT 35.9 (*)    All other components within normal limits  BASIC METABOLIC PANEL - Abnormal; Notable for the following components:   Glucose, Bld 113 (*)    Calcium 8.7 (*)    All other components within normal limits     EKG  ED ECG REPORT I, Jene Every, the attending physician, personally viewed and interpreted this ECG.  Date: 08/16/2022  Rhythm: normal sinus rhythm QRS Axis: normal Intervals: normal ST/T Wave abnormalities: normal Narrative  Interpretation: no evidence of acute ischemia    RADIOLOGY Hip x-ray viewed interpret by me, no fracture, confirmed by radiology    PROCEDURES:  Critical Care performed:   Procedures   MEDICATIONS ORDERED IN ED: Medications - No data to display   IMPRESSION / MDM / ASSESSMENT AND PLAN / ED COURSE  I reviewed the triage vital signs and the nursing notes. Patient's presentation is most consistent with acute complicated illness / injury requiring diagnostic workup.  Patient presents for fall as detailed above, reportedly mechanical.  Will check labs, x-ray of the right hip/pelvis although exam is overall reassuring  Patient has significant dementia, is unable to provide significant history.  Lab work is overall reassuring, no acute abnormalities.  X-rays negative for fracture, appropriate discharge at this time with outpatient follow-up as needed.        FINAL CLINICAL IMPRESSION(S) / ED DIAGNOSES   Final diagnoses:  Fall, initial encounter     Rx / DC Orders  ED Discharge Orders     None        Note:  This document was prepared using Dragon voice recognition software and may include unintentional dictation errors.   Jene Every, MD 08/16/22 [phone]

## 2022-11-09 DEATH — deceased
